# Patient Record
Sex: Female | Born: 1995 | Race: Black or African American | Hispanic: No | Marital: Single
Health system: Midwestern US, Community
[De-identification: ages and names within clinical notes are randomized; demographics above are authoritative.]

## PROBLEM LIST (undated history)

## (undated) ENCOUNTER — Inpatient Hospital Stay (HOSPITAL_COMMUNITY): Payer: Self-pay

## (undated) DIAGNOSIS — Z789 Other specified health status: Secondary | ICD-10-CM

## (undated) HISTORY — PX: NO PAST SURGERIES: SHX2092

---
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
## 2015-02-23 ENCOUNTER — Other Ambulatory Visit: Payer: Self-pay | Admitting: Family Medicine

## 2015-02-24 MED ORDER — NORETHIN ACE-ETH ESTRAD-FE 1-20 MG-MCG PO TABS
ORAL_TABLET | ORAL | Status: DC
Start: 2015-02-24 — End: 2015-06-13

## 2015-03-16 ENCOUNTER — Telehealth: Payer: Self-pay | Admitting: *Deleted

## 2015-03-16 NOTE — Telephone Encounter (Signed)
Unable to reach patient at time of Pre-Visit Call.  Left message for patient to return call when available.    

## 2015-03-17 ENCOUNTER — Encounter: Payer: Self-pay | Admitting: Medical

## 2015-03-17 ENCOUNTER — Ambulatory Visit (INDEPENDENT_AMBULATORY_CARE_PROVIDER_SITE_OTHER): Payer: 59 | Admitting: Medical

## 2015-03-17 VITALS — BP 109/73 | HR 78 | Temp 99.0°F | Wt 246.0 lb

## 2015-03-17 DIAGNOSIS — Z7689 Persons encountering health services in other specified circumstances: Secondary | ICD-10-CM | POA: Insufficient documentation

## 2015-03-17 DIAGNOSIS — Z0000 Encounter for general adult medical examination without abnormal findings: Secondary | ICD-10-CM | POA: Diagnosis not present

## 2015-03-17 DIAGNOSIS — Z0189 Encounter for other specified special examinations: Secondary | ICD-10-CM

## 2015-03-17 DIAGNOSIS — Z111 Encounter for screening for respiratory tuberculosis: Secondary | ICD-10-CM | POA: Diagnosis not present

## 2015-03-17 DIAGNOSIS — Z1329 Encounter for screening for other suspected endocrine disorder: Secondary | ICD-10-CM | POA: Insufficient documentation

## 2015-03-17 DIAGNOSIS — Z23 Encounter for immunization: Secondary | ICD-10-CM

## 2015-03-17 NOTE — Patient Instructions (Addendum)
Wellness examination Pt will get meningo boost, hpv vaccine second, and ppd today. Read ppd on Friday.  Will order cbc since on form and get ua.  Ptt given forms today. She will get provider at other clinic to read ppd since she won't be in area on Friday. Preventive Care for Adults A healthy lifestyle and preventive care can promote health and wellness. Preventive health guidelines for women include the following key practices.  A routine yearly physical is a good way to check with your health care provider about your health and preventive screening. It is a chance to share any concerns and updates on your health and to receive a thorough exam.  Visit your dentist for a routine exam and preventive care every 6 months. Brush your teeth twice a day and floss once a day. Good oral hygiene prevents tooth decay and gum disease.  The frequency of eye exams is based on your age, health, family medical history, use of contact lenses, and other factors. Follow your health care provider's recommendations for frequency of eye exams.  Eat a healthy diet. Foods like vegetables, fruits, whole grains, low-fat dairy products, and lean protein foods contain the nutrients you need without too many calories. Decrease your intake of foods high in solid fats, added sugars, and salt. Eat the right amount of calories for you.Get information about a proper diet from your health care provider, if necessary.  Regular physical exercise is one of the most important things you can do for your health. Most adults should get at least 150 minutes of moderate-intensity exercise (any activity that increases your heart rate and causes you to sweat) each week. In addition, most adults need muscle-strengthening exercises on 2 or more days a week.  Maintain a healthy weight. The body mass index (BMI) is a screening tool to identify possible weight problems. It provides an estimate of body fat based on height and weight. Your health  care provider can find your BMI and can help you achieve or maintain a healthy weight.For adults 20 years and older:  A BMI below 18.5 is considered underweight.  A BMI of 18.5 to 24.9 is normal.  A BMI of 25 to 29.9 is considered overweight.  A BMI of 30 and above is considered obese.  Maintain normal blood lipids and cholesterol levels by exercising and minimizing your intake of saturated fat. Eat a balanced diet with plenty of fruit and vegetables. Blood tests for lipids and cholesterol should begin at age 65 and be repeated every 5 years. If your lipid or cholesterol levels are high, you are over 50, or you are at high risk for heart disease, you may need your cholesterol levels checked more frequently.Ongoing high lipid and cholesterol levels should be treated with medicines if diet and exercise are not working.  If you smoke, find out from your health care provider how to quit. If you do not use tobacco, do not start.  Lung cancer screening is recommended for adults aged 34-80 years who are at high risk for developing lung cancer because of a history of smoking. A yearly low-dose CT scan of the lungs is recommended for people who have at least a 30-pack-year history of smoking and are a current smoker or have quit within the past 15 years. A pack year of smoking is smoking an average of 1 pack of cigarettes a day for 1 year (for example: 1 pack a day for 30 years or 2 packs a day for 15  years). Yearly screening should continue until the smoker has stopped smoking for at least 15 years. Yearly screening should be stopped for people who develop a health problem that would prevent them from having lung cancer treatment.  If you are pregnant, do not drink alcohol. If you are breastfeeding, be very cautious about drinking alcohol. If you are not pregnant and choose to drink alcohol, do not have more than 1 drink per day. One drink is considered to be 12 ounces (355 mL) of beer, 5 ounces (148 mL)  of wine, or 1.5 ounces (44 mL) of liquor.  Avoid use of street drugs. Do not share needles with anyone. Ask for help if you need support or instructions about stopping the use of drugs.  High blood pressure causes heart disease and increases the risk of stroke. Your blood pressure should be checked at least every 1 to 2 years. Ongoing high blood pressure should be treated with medicines if weight loss and exercise do not work.  If you are 3-33 years old, ask your health care provider if you should take aspirin to prevent strokes.  Diabetes screening involves taking a blood sample to check your fasting blood sugar level. This should be done once every 3 years, after age 87, if you are within normal weight and without risk factors for diabetes. Testing should be considered at a younger age or be carried out more frequently if you are overweight and have at least 1 risk factor for diabetes.  Breast cancer screening is essential preventive care for women. You should practice "breast self-awareness." This means understanding the normal appearance and feel of your breasts and may include breast self-examination. Any changes detected, no matter how small, should be reported to a health care provider. Women in their 63s and 30s should have a clinical breast exam (CBE) by a health care provider as part of a regular health exam every 1 to 3 years. After age 45, women should have a CBE every year. Starting at age 24, women should consider having a mammogram (breast X-ray test) every year. Women who have a family history of breast cancer should talk to their health care provider about genetic screening. Women at a high risk of breast cancer should talk to their health care providers about having an MRI and a mammogram every year.  Breast cancer gene (BRCA)-related cancer risk assessment is recommended for women who have family members with BRCA-related cancers. BRCA-related cancers include breast, ovarian, tubal,  and peritoneal cancers. Having family members with these cancers may be associated with an increased risk for harmful changes (mutations) in the breast cancer genes BRCA1 and BRCA2. Results of the assessment will determine the need for genetic counseling and BRCA1 and BRCA2 testing.  Routine pelvic exams to screen for cancer are no longer recommended for nonpregnant women who are considered low risk for cancer of the pelvic organs (ovaries, uterus, and vagina) and who do not have symptoms. Ask your health care provider if a screening pelvic exam is right for you.  If you have had past treatment for cervical cancer or a condition that could lead to cancer, you need Pap tests and screening for cancer for at least 20 years after your treatment. If Pap tests have been discontinued, your risk factors (such as having a new sexual partner) need to be reassessed to determine if screening should be resumed. Some women have medical problems that increase the chance of getting cervical cancer. In these cases, your health care  provider may recommend more frequent screening and Pap tests.  The HPV test is an additional test that may be used for cervical cancer screening. The HPV test looks for the virus that can cause the cell changes on the cervix. The cells collected during the Pap test can be tested for HPV. The HPV test could be used to screen women aged 47 years and older, and should be used in women of any age who have unclear Pap test results. After the age of 59, women should have HPV testing at the same frequency as a Pap test.  Colorectal cancer can be detected and often prevented. Most routine colorectal cancer screening begins at the age of 81 years and continues through age 63 years. However, your health care provider may recommend screening at an earlier age if you have risk factors for colon cancer. On a yearly basis, your health care provider may provide home test kits to check for hidden blood in the  stool. Use of a small camera at the end of a tube, to directly examine the colon (sigmoidoscopy or colonoscopy), can detect the earliest forms of colorectal cancer. Talk to your health care provider about this at age 12, when routine screening begins. Direct exam of the colon should be repeated every 5-10 years through age 54 years, unless early forms of pre-cancerous polyps or small growths are found.  People who are at an increased risk for hepatitis B should be screened for this virus. You are considered at high risk for hepatitis B if:  You were born in a country where hepatitis B occurs often. Talk with your health care provider about which countries are considered high risk.  Your parents were born in a high-risk country and you have not received a shot to protect against hepatitis B (hepatitis B vaccine).  You have HIV or AIDS.  You use needles to inject street drugs.  You live with, or have sex with, someone who has hepatitis B.  You get hemodialysis treatment.  You take certain medicines for conditions like cancer, organ transplantation, and autoimmune conditions.  Hepatitis C blood testing is recommended for all people born from [birth year] through 1965 and any individual with known risks for hepatitis C.  Practice safe sex. Use condoms and avoid high-risk sexual practices to reduce the spread of sexually transmitted infections (STIs). STIs include gonorrhea, chlamydia, syphilis, trichomonas, herpes, HPV, and human immunodeficiency virus (HIV). Herpes, HIV, and HPV are viral illnesses that have no cure. They can result in disability, cancer, and death.  You should be screened for sexually transmitted illnesses (STIs) including gonorrhea and chlamydia if:  You are sexually active and are younger than 24 years.  You are older than 24 years and your health care provider tells you that you are at risk for this type of infection.  Your sexual activity has changed since you were last  screened and you are at an increased risk for chlamydia or gonorrhea. Ask your health care provider if you are at risk.  If you are at risk of being infected with HIV, it is recommended that you take a prescription medicine daily to prevent HIV infection. This is called preexposure prophylaxis (PrEP). You are considered at risk if:  You are a heterosexual woman, are sexually active, and are at increased risk for HIV infection.  You take drugs by injection.  You are sexually active with a partner who has HIV.  Talk with your health care provider about whether you are  at high risk of being infected with HIV. If you choose to begin PrEP, you should first be tested for HIV. You should then be tested every 3 months for as long as you are taking PrEP.  Osteoporosis is a disease in which the bones lose minerals and strength with aging. This can result in serious bone fractures or breaks. The risk of osteoporosis can be identified using a bone density scan. Women ages 34 years and over and women at risk for fractures or osteoporosis should discuss screening with their health care providers. Ask your health care provider whether you should take a calcium supplement or vitamin D to reduce the rate of osteoporosis.  Menopause can be associated with physical symptoms and risks. Hormone replacement therapy is available to decrease symptoms and risks. You should talk to your health care provider about whether hormone replacement therapy is right for you.  Use sunscreen. Apply sunscreen liberally and repeatedly throughout the day. You should seek shade when your shadow is shorter than you. Protect yourself by wearing long sleeves, pants, a wide-brimmed hat, and sunglasses year round, whenever you are outdoors.  Once a month, do a whole body skin exam, using a mirror to look at the skin on your back. Tell your health care provider of new moles, moles that have irregular borders, moles that are larger than a pencil  eraser, or moles that have changed in shape or color.  Stay current with required vaccines (immunizations).  Influenza vaccine. All adults should be immunized every year.  Tetanus, diphtheria, and acellular pertussis (Td, Tdap) vaccine. Pregnant women should receive 1 dose of Tdap vaccine during each pregnancy. The dose should be obtained regardless of the length of time since the last dose. Immunization is preferred during the 27th-36th week of gestation. An adult who has not previously received Tdap or who does not know her vaccine status should receive 1 dose of Tdap. This initial dose should be followed by tetanus and diphtheria toxoids (Td) booster doses every 10 years. Adults with an unknown or incomplete history of completing a 3-dose immunization series with Td-containing vaccines should begin or complete a primary immunization series including a Tdap dose. Adults should receive a Td booster every 10 years.  Varicella vaccine. An adult without evidence of immunity to varicella should receive 2 doses or a second dose if she has previously received 1 dose. Pregnant females who do not have evidence of immunity should receive the first dose after pregnancy. This first dose should be obtained before leaving the health care facility. The second dose should be obtained 4-8 weeks after the first dose.  Human papillomavirus (HPV) vaccine. Females aged 13-26 years who have not received the vaccine previously should obtain the 3-dose series. The vaccine is not recommended for use in pregnant females. However, pregnancy testing is not needed before receiving a dose. If a female is found to be pregnant after receiving a dose, no treatment is needed. In that case, the remaining doses should be delayed until after the pregnancy. Immunization is recommended for any person with an immunocompromised condition through the age of 66 years if she did not get any or all doses earlier. During the 3-dose series, the  second dose should be obtained 4-8 weeks after the first dose. The third dose should be obtained 24 weeks after the first dose and 16 weeks after the second dose.  Zoster vaccine. One dose is recommended for adults aged 60 years or older unless certain conditions are present.  Measles, mumps, and rubella (MMR) vaccine. Adults born before 1957 generally are considered immune to measles and mumps. Adults born in 1957 or later should have 1 or more doses of MMR vaccine unless there is a contraindication to the vaccine or there is laboratory evidence of immunity to each of the three diseases. A routine second dose of MMR vaccine should be obtained at least 28 days after the first dose for students attending postsecondary schools, health care workers, or international travelers. People who received inactivated measles vaccine or an unknown type of measles vaccine during 1963-1967 should receive 2 doses of MMR vaccine. People who received inactivated mumps vaccine or an unknown type of mumps vaccine before 1979 and are at high risk for mumps infection should consider immunization with 2 doses of MMR vaccine. For females of childbearing age, rubella immunity should be determined. If there is no evidence of immunity, females who are not pregnant should be vaccinated. If there is no evidence of immunity, females who are pregnant should delay immunization until after pregnancy. Unvaccinated health care workers born before 1957 who lack laboratory evidence of measles, mumps, or rubella immunity or laboratory confirmation of disease should consider measles and mumps immunization with 2 doses of MMR vaccine or rubella immunization with 1 dose of MMR vaccine.  Pneumococcal 13-valent conjugate (PCV13) vaccine. When indicated, a person who is uncertain of her immunization history and has no record of immunization should receive the PCV13 vaccine. An adult aged 19 years or older who has certain medical conditions and has not  been previously immunized should receive 1 dose of PCV13 vaccine. This PCV13 should be followed with a dose of pneumococcal polysaccharide (PPSV23) vaccine. The PPSV23 vaccine dose should be obtained at least 8 weeks after the dose of PCV13 vaccine. An adult aged 19 years or older who has certain medical conditions and previously received 1 or more doses of PPSV23 vaccine should receive 1 dose of PCV13. The PCV13 vaccine dose should be obtained 1 or more years after the last PPSV23 vaccine dose.  Pneumococcal polysaccharide (PPSV23) vaccine. When PCV13 is also indicated, PCV13 should be obtained first. All adults aged 65 years and older should be immunized. An adult younger than age 65 years who has certain medical conditions should be immunized. Any person who resides in a nursing home or long-term care facility should be immunized. An adult smoker should be immunized. People with an immunocompromised condition and certain other conditions should receive both PCV13 and PPSV23 vaccines. People with human immunodeficiency virus (HIV) infection should be immunized as soon as possible after diagnosis. Immunization during chemotherapy or radiation therapy should be avoided. Routine use of PPSV23 vaccine is not recommended for American Indians, [place] Natives, or people younger than 65 years unless there are medical conditions that require PPSV23 vaccine. When indicated, people who have unknown immunization and have no record of immunization should receive PPSV23 vaccine. One-time revaccination 5 years after the first dose of PPSV23 is recommended for people aged 19-64 years who have chronic kidney failure, nephrotic syndrome, asplenia, or immunocompromised conditions. People who received 1-2 doses of PPSV23 before age 65 years should receive another dose of PPSV23 vaccine at age 65 years or later if at least 5 years have passed since the previous dose. Doses of PPSV23 are not needed for people immunized with PPSV23 at  or after age 65 years.  Meningococcal vaccine. Adults with asplenia or persistent complement component deficiencies should receive 2 doses of quadrivalent meningococcal conjugate (MenACWY-D) vaccine.   The doses should be obtained at least 2 months apart. Microbiologists working with certain meningococcal bacteria, [place] recruits, people at risk during an outbreak, and people who travel to or live in countries with a high rate of meningitis should be immunized. A first-year college student up through age 29 years who is living in a residence hall should receive a dose if she did not receive a dose on or after her 16th birthday. Adults who have certain high-risk conditions should receive one or more doses of vaccine.  Hepatitis A vaccine. Adults who wish to be protected from this disease, have certain high-risk conditions, work with hepatitis A-infected animals, work in hepatitis A research labs, or travel to or work in countries with a high rate of hepatitis A should be immunized. Adults who were previously unvaccinated and who anticipate close contact with an international adoptee during the first 60 days after arrival in the [place] from a country with a high rate of hepatitis A should be immunized.  Hepatitis B vaccine. Adults who wish to be protected from this disease, have certain high-risk conditions, may be exposed to blood or other infectious body fluids, are household contacts or sex partners of hepatitis B positive people, are clients or workers in certain care facilities, or travel to or work in countries with a high rate of hepatitis B should be immunized.  Haemophilus influenzae type b (Hib) vaccine. A previously unvaccinated person with asplenia or sickle cell disease or having a scheduled splenectomy should receive 1 dose of Hib vaccine. Regardless of previous immunization, a recipient of a hematopoietic stem cell transplant should receive a 3-dose series 6-12 months after her  successful transplant. Hib vaccine is not recommended for adults with HIV infection. Preventive Services / Frequency Ages 52 to 38 years  Blood pressure check.** / Every 1 to 2 years.  Lipid and cholesterol check.** / Every 5 years beginning at age 16.  Clinical breast exam.** / Every 3 years for women in their 41s and 77s.  BRCA-related cancer risk assessment.** / For women who have family members with a BRCA-related cancer (breast, ovarian, tubal, or peritoneal cancers).  Pap test.** / Every 2 years from ages 17 through 74. Every 3 years starting at age 11 through age 69 or 61 with a history of 3 consecutive normal Pap tests.  HPV screening.** / Every 3 years from ages 12 through ages 3 to 1 with a history of 3 consecutive normal Pap tests.  Hepatitis C blood test.** / For any individual with known risks for hepatitis C.  Skin self-exam. / Monthly.  Influenza vaccine. / Every year.  Tetanus, diphtheria, and acellular pertussis (Tdap, Td) vaccine.** / Consult your health care provider. Pregnant women should receive 1 dose of Tdap vaccine during each pregnancy. 1 dose of Td every 10 years.  Varicella vaccine.** / Consult your health care provider. Pregnant females who do not have evidence of immunity should receive the first dose after pregnancy.  HPV vaccine. / 3 doses over 6 months, if 30 and younger. The vaccine is not recommended for use in pregnant females. However, pregnancy testing is not needed before receiving a dose.  Measles, mumps, rubella (MMR) vaccine.** / You need at least 1 dose of MMR if you were born in 1957 or later. You may also need a 2nd dose. For females of childbearing age, rubella immunity should be determined. If there is no evidence of immunity, females who are not pregnant should be vaccinated. If there is  no evidence of immunity, females who are pregnant should delay immunization until after pregnancy.  Pneumococcal 13-valent conjugate (PCV13) vaccine.** /  Consult your health care provider.  Pneumococcal polysaccharide (PPSV23) vaccine.** / 1 to 2 doses if you smoke cigarettes or if you have certain conditions.  Meningococcal vaccine.** / 1 dose if you are age 19 to 21 years and a first-year college student living in a residence hall, or have one of several medical conditions, you need to get vaccinated against meningococcal disease. You may also need additional booster doses.  Hepatitis A vaccine.** / Consult your health care provider.  Hepatitis B vaccine.** / Consult your health care provider.  Haemophilus influenzae type b (Hib) vaccine.** / Consult your health care provider. Ages 40 to 64 years  Blood pressure check.** / Every 1 to 2 years.  Lipid and cholesterol check.** / Every 5 years beginning at age 20 years.  Lung cancer screening. / Every year if you are aged 55-80 years and have a 30-pack-year history of smoking and currently smoke or have quit within the past 15 years. Yearly screening is stopped once you have quit smoking for at least 15 years or develop a health problem that would prevent you from having lung cancer treatment.  Clinical breast exam.** / Every year after age 40 years.  BRCA-related cancer risk assessment.** / For women who have family members with a BRCA-related cancer (breast, ovarian, tubal, or peritoneal cancers).  Mammogram.** / Every year beginning at age 40 years and continuing for as long as you are in good health. Consult with your health care provider.  Pap test.** / Every 3 years starting at age 30 years through age 65 or 70 years with a history of 3 consecutive normal Pap tests.  HPV screening.** / Every 3 years from ages 30 years through ages 65 to 70 years with a history of 3 consecutive normal Pap tests.  Fecal occult blood test (FOBT) of stool. / Every year beginning at age 50 years and continuing until age 75 years. You may not need to do this test if you get a colonoscopy every 10  years.  Flexible sigmoidoscopy or colonoscopy.** / Every 5 years for a flexible sigmoidoscopy or every 10 years for a colonoscopy beginning at age 50 years and continuing until age 75 years.  Hepatitis C blood test.** / For all people born from 1945 through 1965 and any individual with known risks for hepatitis C.  Skin self-exam. / Monthly.  Influenza vaccine. / Every year.  Tetanus, diphtheria, and acellular pertussis (Tdap/Td) vaccine.** / Consult your health care provider. Pregnant women should receive 1 dose of Tdap vaccine during each pregnancy. 1 dose of Td every 10 years.  Varicella vaccine.** / Consult your health care provider. Pregnant females who do not have evidence of immunity should receive the first dose after pregnancy.  Zoster vaccine.** / 1 dose for adults aged 60 years or older.  Measles, mumps, rubella (MMR) vaccine.** / You need at least 1 dose of MMR if you were born in 1957 or later. You may also need a 2nd dose. For females of childbearing age, rubella immunity should be determined. If there is no evidence of immunity, females who are not pregnant should be vaccinated. If there is no evidence of immunity, females who are pregnant should delay immunization until after pregnancy.  Pneumococcal 13-valent conjugate (PCV13) vaccine.** / Consult your health care provider.  Pneumococcal polysaccharide (PPSV23) vaccine.** / 1 to 2 doses if you smoke cigarettes   or if you have certain conditions.  Meningococcal vaccine.** / Consult your health care provider.  Hepatitis A vaccine.** / Consult your health care provider.  Hepatitis B vaccine.** / Consult your health care provider.  Haemophilus influenzae type b (Hib) vaccine.** / Consult your health care provider. Ages 47 years and over  Blood pressure check.** / Every 1 to 2 years.  Lipid and cholesterol check.** / Every 5 years beginning at age 48 years.  Lung cancer screening. / Every year if you are aged 68-80 years  and have a 30-pack-year history of smoking and currently smoke or have quit within the past 15 years. Yearly screening is stopped once you have quit smoking for at least 15 years or develop a health problem that would prevent you from having lung cancer treatment.  Clinical breast exam.** / Every year after age 60 years.  BRCA-related cancer risk assessment.** / For women who have family members with a BRCA-related cancer (breast, ovarian, tubal, or peritoneal cancers).  Mammogram.** / Every year beginning at age 64 years and continuing for as long as you are in good health. Consult with your health care provider.  Pap test.** / Every 3 years starting at age 15 years through age 14 or 23 years with 3 consecutive normal Pap tests. Testing can be stopped between 65 and 70 years with 3 consecutive normal Pap tests and no abnormal Pap or HPV tests in the past 10 years.  HPV screening.** / Every 3 years from ages 35 years through ages 29 or 55 years with a history of 3 consecutive normal Pap tests. Testing can be stopped between 65 and 70 years with 3 consecutive normal Pap tests and no abnormal Pap or HPV tests in the past 10 years.  Fecal occult blood test (FOBT) of stool. / Every year beginning at age 72 years and continuing until age 46 years. You may not need to do this test if you get a colonoscopy every 10 years.  Flexible sigmoidoscopy or colonoscopy.** / Every 5 years for a flexible sigmoidoscopy or every 10 years for a colonoscopy beginning at age 48 years and continuing until age 50 years.  Hepatitis C blood test.** / For all people born from [birth year] through 1965 and any individual with known risks for hepatitis C.  Osteoporosis screening.** / A one-time screening for women ages 25 years and over and women at risk for fractures or osteoporosis.  Skin self-exam. / Monthly.  Influenza vaccine. / Every year.  Tetanus, diphtheria, and acellular pertussis (Tdap/Td) vaccine.** / 1 dose of Td  every 10 years.  Varicella vaccine.** / Consult your health care provider.  Zoster vaccine.** / 1 dose for adults aged 76 years or older.  Pneumococcal 13-valent conjugate (PCV13) vaccine.** / Consult your health care provider.  Pneumococcal polysaccharide (PPSV23) vaccine.** / 1 dose for all adults aged 3 years and older.  Meningococcal vaccine.** / Consult your health care provider.  Hepatitis A vaccine.** / Consult your health care provider.  Hepatitis B vaccine.** / Consult your health care provider.  Haemophilus influenzae type b (Hib) vaccine.** / Consult your health care provider. ** Family history and personal history of risk and conditions may change your health care provider's recommendations. Document Released: 11/14/2001 Document Revised: 02/02/2014 Document Reviewed: 02/13/2011 [facility] Patient Information 2015 [place], [place]. This information is not intended to replace advice given to you by your health care provider. Make sure you discuss any questions you have with your health care provider.
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
## 2015-03-17 NOTE — Assessment & Plan Note (Signed)
Pt will get meningo boost, hpv vaccine second, and ppd today. Read ppd on Friday.  Will order cbc since on form and get ua.

## 2015-03-18 LAB — CBC WITH DIFFERENTIAL/PLATELET
Basophils Absolute: 0.1 10*3/uL (ref 0.0–0.1)
Basophils Relative: 0.6 % (ref 0.0–3.0)
EOS ABS: 0.1 10*3/uL (ref 0.0–0.7)
Eosinophils Relative: 1 % (ref 0.0–5.0)
HCT: 35.3 % — ABNORMAL LOW (ref 36.0–49.0)
Hemoglobin: 11.3 g/dL — ABNORMAL LOW (ref 12.0–16.0)
LYMPHS PCT: 33.7 % (ref 24.0–48.0)
Lymphs Abs: 3.1 10*3/uL (ref 0.7–4.0)
MCHC: 32.1 g/dL (ref 31.0–37.0)
MCV: 79.6 fl (ref 78.0–98.0)
Monocytes Absolute: 0.2 10*3/uL (ref 0.1–1.0)
Monocytes Relative: 1.8 % — ABNORMAL LOW (ref 3.0–12.0)
NEUTROS PCT: 62.9 % (ref 43.0–71.0)
Neutro Abs: 5.7 10*3/uL (ref 1.4–7.7)
Platelets: 471 10*3/uL (ref 150.0–575.0)
RBC: 4.43 Mil/uL (ref 3.80–5.70)
RDW: 15.8 % — ABNORMAL HIGH (ref 11.4–15.5)
WBC: 9.1 10*3/uL (ref 4.5–13.5)

## 2015-03-18 NOTE — Addendum Note (Signed)
Addended by: Lurline Hare on: 03/18/2015 08:37 AM   Modules accepted: Orders

## 2015-03-19 LAB — TB SKIN TEST
Induration: 0 mm
TB Skin Test: NEGATIVE

## 2015-03-19 NOTE — Addendum Note (Signed)
Addended by: Noreene Larsson A on: 03/19/2015 02:09 PM   Modules accepted: Orders

## 2015-04-28 ENCOUNTER — Encounter: Payer: Self-pay | Admitting: Physician Assistant

## 2015-04-28 ENCOUNTER — Other Ambulatory Visit: Payer: Self-pay | Admitting: Physician Assistant

## 2015-04-28 ENCOUNTER — Ambulatory Visit (INDEPENDENT_AMBULATORY_CARE_PROVIDER_SITE_OTHER): Payer: 59 | Admitting: Physician Assistant

## 2015-04-28 VITALS — BP 120/62 | HR 91 | Temp 97.9°F | Ht 66.5 in | Wt 244.0 lb

## 2015-04-28 DIAGNOSIS — Z7251 High risk heterosexual behavior: Secondary | ICD-10-CM

## 2015-04-28 NOTE — Assessment & Plan Note (Signed)
Urine pregnancy negative. Will obtain serum pregnancy due to her anxiety/concerns. Discussed restarting OCPs to prevent this scare again if serum pregnancy is negative.  Safe sex practices reviewed. Will follow-up based on lab results.

## 2015-04-28 NOTE — Progress Notes (Signed)
Patient presents to clinic today c/o skipped period. Endorses LMP at beginning of June. Is currently sexually active without consistent use of protection. Is prescribed OCPs but has not taken in over 2 months per patient. Patient feels she may be pregnant. Has taken a home pregnancy test that was negative. Was told by manufacturer if she had concerns, she should see healthcare provider.  Past Medical History  Diagnosis Date  . Urinary incontinence   . Menstrual cramps     Current Outpatient Prescriptions on File Prior to Visit  Medication Sig Dispense Refill  . naproxen sodium (ANAPROX) 220 MG tablet Take 220 mg by mouth as needed.    . norethindrone-ethinyl estradiol (GILDESS FE 1/20) 1-20 MG-MCG tablet Take 1 tablet by mouth  daily (Patient not taking: Reported on 04/28/2015) 28 tablet 6   No current facility-administered medications on file prior to visit.    No Known Allergies  Family History  Problem Relation Age of Onset  . Prostate cancer    . Diabetes Maternal Grandmother   . Diabetes Maternal Grandfather   . Heart disease Maternal Grandfather   . Hypertension Maternal Grandfather     History   Social History  . Marital Status: Single    Spouse Name: N/A  . Number of Children: N/A  . Years of Education: N/A   Social History Main Topics  . Smoking status: Never Smoker   . Smokeless tobacco: Never Used  . Alcohol Use: No  . Drug Use: No  . Sexual Activity: No   Other Topics Concern  . None   Social History Narrative   Exercise--- softball    Review of Systems - See HPI.  All other ROS are negative.  BP 120/62 mmHg  Pulse 91  Temp(Src) 97.9 F (36.6 C) (Oral)  Ht 5' 6.5" (1.689 m)  Wt 244 lb (110.678 kg)  BMI 38.80 kg/m2  SpO2 98%  LMP 03/17/2015  Physical Exam  Constitutional: She is oriented to person, place, and time and well-developed, well-nourished, and in no distress.  HENT:  Head: Normocephalic and atraumatic.  Cardiovascular: Normal  rate, regular rhythm, normal heart sounds and intact distal pulses.   Pulmonary/Chest: Effort normal and breath sounds normal. No respiratory distress. She has no wheezes. She has no rales. She exhibits no tenderness.  Neurological: She is alert and oriented to person, place, and time.  Vitals reviewed.   Recent Results (from the past 2160 hour(s))  CBC w/Diff     Status: Abnormal   Collection Time: 03/17/15  2:51 PM  Result Value Ref Range   WBC 9.1 4.5 - 13.5 K/uL   RBC 4.43 3.80 - 5.70 Mil/uL   Hemoglobin 11.3 (L) 12.0 - 16.0 g/dL   HCT 60.4 (L) 54.0 - 98.1 %   MCV 79.6 78.0 - 98.0 fl   MCHC 32.1 31.0 - 37.0 g/dL   RDW 19.1 (H) 47.8 - 29.5 %   Platelets 471.0 150.0 - 575.0 K/uL   Neutrophils Relative % 62.9 43.0 - 71.0 %   Lymphocytes Relative 33.7 24.0 - 48.0 %   Monocytes Relative 1.8 (L) 3.0 - 12.0 %   Eosinophils Relative 1.0 0.0 - 5.0 %   Basophils Relative 0.6 0.0 - 3.0 %   Neutro Abs 5.7 1.4 - 7.7 K/uL   Lymphs Abs 3.1 0.7 - 4.0 K/uL   Monocytes Absolute 0.2 0.1 - 1.0 K/uL   Eosinophils Absolute 0.1 0.0 - 0.7 K/uL   Basophils Absolute 0.1 0.0 -  0.1 K/uL  PPD     Status: Normal   Collection Time: 03/19/15  2:00 PM  Result Value Ref Range   TB Skin Test Negative    Induration 0 mm    Assessment/Plan: Unprotected sex Urine pregnancy negative. Will obtain serum pregnancy due to her anxiety/concerns. Discussed restarting OCPs to prevent this scare again if serum pregnancy is negative.  Safe sex practices reviewed. Will follow-up based on lab results.

## 2015-04-28 NOTE — Progress Notes (Signed)
Pre visit review using our clinic review tool, if applicable. No additional management support is needed unless otherwise documented below in the visit note. 

## 2015-04-28 NOTE — Patient Instructions (Signed)
Your urine pregnancy test is negative. Since you are worried, please stop by front desk to schedule a lab appointment so we can do a blood pregnancy test.  I will call you with your results.  If all is negative I recommend you restart your birth control medication to help reduce scares in the future. Always remember to practice safe sex!

## 2015-04-29 ENCOUNTER — Other Ambulatory Visit: Payer: 59

## 2015-05-20 ENCOUNTER — Ambulatory Visit: Payer: 59 | Admitting: Family Medicine

## 2015-06-13 ENCOUNTER — Inpatient Hospital Stay (HOSPITAL_COMMUNITY): Payer: 59

## 2015-06-13 ENCOUNTER — Encounter (HOSPITAL_COMMUNITY): Payer: Self-pay | Admitting: *Deleted

## 2015-06-13 ENCOUNTER — Inpatient Hospital Stay (HOSPITAL_COMMUNITY)
Admission: AD | Admit: 2015-06-13 | Discharge: 2015-06-13 | Disposition: A | Discharge status: Home / Self Care | Payer: 59 | Source: Ambulatory Visit | Attending: Obstetrics & Gynecology | Admitting: Obstetrics & Gynecology

## 2015-06-13 DIAGNOSIS — O2341 Unspecified infection of urinary tract in pregnancy, first trimester: Secondary | ICD-10-CM

## 2015-06-13 DIAGNOSIS — O26899 Other specified pregnancy related conditions, unspecified trimester: Secondary | ICD-10-CM

## 2015-06-13 DIAGNOSIS — O9989 Other specified diseases and conditions complicating pregnancy, childbirth and the puerperium: Secondary | ICD-10-CM

## 2015-06-13 DIAGNOSIS — R109 Unspecified abdominal pain: Secondary | ICD-10-CM | POA: Diagnosis not present

## 2015-06-13 DIAGNOSIS — Z3A08 8 weeks gestation of pregnancy: Secondary | ICD-10-CM | POA: Diagnosis not present

## 2015-06-13 DIAGNOSIS — Z3491 Encounter for supervision of normal pregnancy, unspecified, first trimester: Secondary | ICD-10-CM

## 2015-06-13 HISTORY — DX: Other specified health status: Z78.9

## 2015-06-13 LAB — URINALYSIS, ROUTINE W REFLEX MICROSCOPIC
BILIRUBIN URINE: NEGATIVE
Glucose, UA: NEGATIVE mg/dL
Ketones, ur: NEGATIVE mg/dL
Nitrite: NEGATIVE
Protein, ur: NEGATIVE mg/dL
Specific Gravity, Urine: >1.03 — ABNORMAL HIGH (ref 1.005–1.030)
UROBILINOGEN UA: 0.2 mg/dL (ref 0.0–1.0)
pH: 5.5 (ref 5.0–8.0)

## 2015-06-13 LAB — CBC
HEMATOCRIT: 36 % (ref 36.0–46.0)
HEMOGLOBIN: 11.9 g/dL — AB (ref 12.0–15.0)
MCH: 26.2 pg (ref 26.0–34.0)
MCHC: 33.1 g/dL (ref 30.0–36.0)
MCV: 79.1 fL (ref 78.0–100.0)
Platelets: 415 10*3/uL — ABNORMAL HIGH (ref 150–400)
RBC: 4.55 MIL/uL (ref 3.87–5.11)
RDW: 15.8 % — ABNORMAL HIGH (ref 11.5–15.5)
WBC: 9.7 10*3/uL (ref 4.0–10.5)

## 2015-06-13 LAB — URINE MICROSCOPIC-ADD ON: RBC / HPF: NONE SEEN RBC/hpf (ref <3)

## 2015-06-13 LAB — WET PREP, GENITAL
TRICH WET PREP: NONE SEEN
YEAST WET PREP: NONE SEEN

## 2015-06-13 LAB — ABO/RH: ABO/RH(D): O POS

## 2015-06-13 LAB — HCG, QUANTITATIVE, PREGNANCY: HCG, BETA CHAIN, QUANT, S: 123257 m[IU]/mL — AB (ref <5)

## 2015-06-13 MED ORDER — CEPHALEXIN 500 MG PO CAPS: 500.0000 mg | ORAL_CAPSULE | Freq: Four times a day (QID) | ORAL | Status: DC

## 2015-06-13 NOTE — MAU Note (Signed)
Having abd cramping- mid abd. Started yesterday, was really bad this morning. Has been vomiting, denies diarrhea or constipation. Denies dysuria

## 2015-06-13 NOTE — MAU Note (Signed)
preg confirmed at [facility] '

## 2015-06-13 NOTE — MAU Provider Note (Signed)
Chief Complaint: Abdominal Cramping   First Provider Initiated Contact with Patient 06/13/15 1739      SUBJECTIVE HPI: Maria Simpson is a 19 y.o. G1P0 at [redacted]w[redacted]d by LMP who presents to maternity admissions reporting abdominal pain described as cramping in her lower abdomen x 3-4 days, worsening today.  She has not yet started prenatal care in this pregnancy.   She denies vaginal bleeding, vaginal itching/burning, urinary symptoms, h/a, dizziness, n/v, or fever/chills.     Abdominal Cramping This is a new problem. The current episode started in the past 7 days. The onset quality is gradual. The problem occurs intermittently. The problem has been waxing and waning. The pain is located in the RLQ, LLQ and suprapubic region. The pain is moderate. The quality of the pain is cramping and sharp. The abdominal pain does not radiate. Associated symptoms include nausea and vomiting. Pertinent negatives include no constipation, diarrhea, dysuria, fever, frequency or headaches. She has tried nothing for the symptoms.    Past Medical History  Diagnosis Date  . Urinary incontinence   . Menstrual cramps   . Medical history non-contributory    Past Surgical History  Procedure Laterality Date  . No past surgeries     Social History   Social History  . Marital Status: Single    Spouse Name: N/A  . Number of Children: N/A  . Years of Education: N/A   Occupational History  . Not on file.   Social History Main Topics  . Smoking status: Never Smoker   . Smokeless tobacco: Never Used  . Alcohol Use: No  . Drug Use: No  . Sexual Activity: Yes    Birth Control/ Protection: None   Other Topics Concern  . Not on file   Social History Narrative   Exercise--- softball   No current facility-administered medications on file prior to encounter.   Current Outpatient Prescriptions on File Prior to Encounter  Medication Sig Dispense Refill  . norethindrone-ethinyl estradiol (GILDESS FE 1/20) 1-20  MG-MCG tablet Take 1 tablet by mouth  daily (Patient not taking: Reported on 04/28/2015) 28 tablet 6   No Known Allergies  ROS:  Review of Systems  Constitutional: Negative for fever, chills and fatigue.  HENT: Negative for sinus pressure.   Eyes: Negative for photophobia.  Respiratory: Negative for shortness of breath.   Cardiovascular: Negative for chest pain.  Gastrointestinal: Positive for nausea and vomiting. Negative for diarrhea and constipation.  Genitourinary: Negative for dysuria, frequency, flank pain, vaginal bleeding, vaginal discharge, difficulty urinating, vaginal pain and pelvic pain.  Musculoskeletal: Negative for neck pain.  Neurological: Negative for dizziness, weakness and headaches.  Psychiatric/Behavioral: Negative.      I have reviewed patient's Past Medical Hx, Surgical Hx, Family Hx, Social Hx, medications and allergies.   Physical Exam   Patient Vitals for the past 24 hrs:  BP Temp Temp src Pulse Resp Height Weight  06/13/15 1637 109/76 mmHg 98.8 F (37.1 C) Oral 94 18  (1.702 m) 107.593 kg (237 lb 3.2 oz)   Constitutional: Well-developed, well-nourished female in no acute distress.  Cardiovascular: normal rate Respiratory: normal effort GI: Abd soft, non-tender. Pos BS x 4 MS: Extremities nontender, no edema, normal ROM Neurologic: Alert and oriented x 4.  GU: Neg CVAT.  PELVIC EXAM: Cervix pink, visually closed, without lesion, scant white creamy discharge, vaginal walls and external genitalia normal Bimanual exam: Cervix 0/long/high, firm, anterior, neg CMT, uterus nontender, nonenlarged, adnexa without tenderness, enlargement, or mass  LAB RESULTS Results for orders placed or performed during the hospital encounter of 06/13/15 (from the past 24 hour(s))  Urinalysis, Routine w reflex microscopic (not at [facility])     Status: Abnormal   Collection Time: 06/13/15  4:40 PM  Result Value Ref Range   Color, Urine YELLOW YELLOW   APPearance HAZY  (A) CLEAR   Specific Gravity, Urine >1.030 (H) 1.005 - 1.030   pH 5.5 5.0 - 8.0   Glucose, UA NEGATIVE NEGATIVE mg/dL   Hgb urine dipstick TRACE (A) NEGATIVE   Bilirubin Urine NEGATIVE NEGATIVE   Ketones, ur NEGATIVE NEGATIVE mg/dL   Protein, ur NEGATIVE NEGATIVE mg/dL   Urobilinogen, UA 0.2 0.0 - 1.0 mg/dL   Nitrite NEGATIVE NEGATIVE   Leukocytes, UA MODERATE (A) NEGATIVE  Urine microscopic-add on     Status: None   Collection Time: 06/13/15  4:40 PM  Result Value Ref Range   Squamous Epithelial / LPF RARE RARE   WBC, UA TOO NUMEROUS TO COUNT <3 WBC/hpf   RBC / HPF  <3 RBC/hpf    NO FORMED ELEMENTS SEEN ON URINE MICROSCOPIC EXAMINATION   Bacteria, UA RARE RARE   Urine-Other MUCOUS PRESENT   CBC     Status: Abnormal   Collection Time: 06/13/15  4:45 PM  Result Value Ref Range   WBC 9.7 4.0 - 10.5 K/uL   RBC 4.55 3.87 - 5.11 MIL/uL   Hemoglobin 11.9 (L) 12.0 - 15.0 g/dL   HCT 16.1 09.6 - 04.5 %   MCV 79.1 78.0 - 100.0 fL   MCH 26.2 26.0 - 34.0 pg   MCHC 33.1 30.0 - 36.0 g/dL   RDW 40.9 (H) 81.1 - 91.4 %   Platelets 415 (H) 150 - 400 K/uL  hCG, quantitative, pregnancy     Status: Abnormal   Collection Time: 06/13/15  5:15 PM  Result Value Ref Range   hCG, Beta Chain, Quant, S 123257 (H) <5 mIU/mL  ABO/Rh     Status: None (Preliminary result)   Collection Time: 06/13/15  5:15 PM  Result Value Ref Range   ABO/RH(D) O POS   Wet prep, genital     Status: Abnormal   Collection Time: 06/13/15  5:28 PM  Result Value Ref Range   Yeast Wet Prep HPF POC NONE SEEN NONE SEEN   Trich, Wet Prep NONE SEEN NONE SEEN   Clue Cells Wet Prep HPF POC RARE (A) NONE SEEN   WBC, Wet Prep HPF POC FEW (A) NONE SEEN    --/--/O POS (09/11 1715)  IMAGING US Ob Comp Less 14 Wks  06/13/2015   CLINICAL DATA:  First trimester pregnancy, abdominal pain.  EXAM: OBSTETRIC <14 WK [place] AND TRANSVAGINAL OB US  TECHNIQUE: Both transabdominal and transvaginal ultrasound examinations were performed for  complete evaluation of the gestation as well as the maternal uterus, adnexal regions, and pelvic cul-de-sac. Transvaginal technique was performed to assess early pregnancy.  COMPARISON:  None.  FINDINGS: Intrauterine gestational sac: Visualized/normal in shape.  Yolk sac:  Visualized.  Embryo:  Visualized.  Cardiac Activity: Visualized.  Heart Rate: 157  bpm  CRL:  19.2  mm   8 w   3 d                  [place] EDC: January 20, 2016.  Maternal uterus/adnexae: No free fluid is noted. Ovaries appear normal.  IMPRESSION: Single live intrauterine gestation of 8 weeks 3 days.   Electronically Signed   By: Fayrene Fearing  Christen Butter, M.D.   On: 06/13/2015 19:50   US Ob Transvaginal  06/13/2015   CLINICAL DATA:  First trimester pregnancy, abdominal pain.  EXAM: OBSTETRIC <14 WK [place] AND TRANSVAGINAL OB US  TECHNIQUE: Both transabdominal and transvaginal ultrasound examinations were performed for complete evaluation of the gestation as well as the maternal uterus, adnexal regions, and pelvic cul-de-sac. Transvaginal technique was performed to assess early pregnancy.  COMPARISON:  None.  FINDINGS: Intrauterine gestational sac: Visualized/normal in shape.  Yolk sac:  Visualized.  Embryo:  Visualized.  Cardiac Activity: Visualized.  Heart Rate: 157  bpm  CRL:  19.2  mm   8 w   3 d                  [place] EDC: January 20, 2016.  Maternal uterus/adnexae: No free fluid is noted. Ovaries appear normal.  IMPRESSION: Single live intrauterine gestation of 8 weeks 3 days.   Electronically Signed   By: Lupita Raider, M.D.   On: 06/13/2015 19:50    MAU Management/MDM: Ordered labs and [place] and reviewed results. UA with large leukocytes and suprapubic pain.  Treat for UTI, urine sent for culture.  Pt stable at time of discharge.  ASSESSMENT 1. Normal IUP (intrauterine pregnancy) on prenatal ultrasound, first trimester   2. Abdominal pain in pregnancy   3. UTI in pregnancy, antepartum, first trimester     PLAN Discharge home with first trimester  pregnancy precautions Urine sent for culture Keflex 500 mg QID x 7 days Discussed safe OTC medications for nausea F/U with early prenatal care Return to MAU as needed for emergencies    Medication List    STOP taking these medications        norethindrone-ethinyl estradiol 1-20 MG-MCG tablet  Commonly known as:  GILDESS FE 1/20      TAKE these medications        cephALEXin 500 MG capsule  Commonly known as:  KEFLEX  Take 1 capsule (500 mg total) by mouth 4 (four) times daily.     prenatal multivitamin Tabs tablet  Take 1 tablet by mouth daily.       Follow-up Information    Please follow up.   Why:  Start prenatal care as soon as possible      Follow up with [facility].   Why:  As needed for emergencies   Contact information:   [address] [id number] mc [place] [address] [phone]      Sharen Counter Certified Nurse-Midwife 06/13/2015  8:00 PM

## 2015-06-13 NOTE — Discharge Instructions (Signed)

## 2015-06-14 LAB — HIV ANTIBODY (ROUTINE TESTING W REFLEX): HIV SCREEN 4TH GENERATION: NONREACTIVE

## 2015-06-14 LAB — GC/CHLAMYDIA PROBE AMP (~~LOC~~) NOT AT ARMC
Chlamydia: NEGATIVE
Neisseria Gonorrhea: NEGATIVE

## 2015-06-16 LAB — CULTURE, OB URINE: Culture: 80000

## 2015-06-17 ENCOUNTER — Telehealth (HOSPITAL_COMMUNITY): Payer: Self-pay | Admitting: Obstetrics and Gynecology

## 2015-06-17 MED ORDER — SULFAMETHOXAZOLE-TRIMETHOPRIM 800-160 MG PO TABS: 1.0000 | ORAL_TABLET | Freq: Two times a day (BID) | ORAL | Status: AC

## 2015-06-17 NOTE — Telephone Encounter (Signed)
+   urine culture- antibiotics changed due to sensitivity. Discussed with Dr. Shawnie Pons. Bractrim sent to Pharmacy.

## 2015-07-27 LAB — OB RESULTS CONSOLE RPR: RPR: NONREACTIVE

## 2015-07-27 LAB — OB RESULTS CONSOLE ANTIBODY SCREEN: ANTIBODY SCREEN: NEGATIVE

## 2015-07-27 LAB — OB RESULTS CONSOLE ABO/RH: RH TYPE: POSITIVE

## 2015-07-27 LAB — OB RESULTS CONSOLE HEPATITIS B SURFACE ANTIGEN: HEP B S AG: NEGATIVE

## 2015-07-27 LAB — OB RESULTS CONSOLE HIV ANTIBODY (ROUTINE TESTING): HIV: NONREACTIVE

## 2015-07-27 LAB — OB RESULTS CONSOLE RUBELLA ANTIBODY, IGM: RUBELLA: IMMUNE

## 2015-10-03 NOTE — L&D Delivery Note (Signed)
Delivery Note Pt pushed great and at 9:01 AM a viable female was delivered via Vaginal, Spontaneous Delivery (Presentation: ; Occiput Anterior).  APGAR: 9, 9; weight pending .   Placenta status: Intact, Spontaneous.  Cord: 3 vessels with the following complications: None. Meconium stained fluid bulb suctioned with vigorous cry. Anesthesia: Epidural  Episiotomy: None Lacerations: 1st degree;Labial Suture Repair: 3.0 vicryl rapide for hemostasis Est. Blood Loss (mL): 225ml  Mom to postpartum.  Baby to Couplet care / Skin to Skin. D/w pt circurmcision and she will likely proceed in the office.  Oliver PilaRICHARDSON,Maria Simpson, 9:26 AM

## 2015-12-23 LAB — OB RESULTS CONSOLE GBS: GBS: NEGATIVE

## 2015-12-23 LAB — OB RESULTS CONSOLE GC/CHLAMYDIA
Chlamydia: NEGATIVE
GC PROBE AMP, GENITAL: NEGATIVE

## 2016-01-14 ENCOUNTER — Inpatient Hospital Stay (HOSPITAL_COMMUNITY)
Admission: AD | Admit: 2016-01-14 | Discharge: 2016-01-17 | DRG: 775 | Disposition: A | Discharge status: Home / Self Care | Payer: 59 | Source: Ambulatory Visit | Attending: Obstetrics and Gynecology | Admitting: Obstetrics and Gynecology

## 2016-01-14 DIAGNOSIS — Z8249 Family history of ischemic heart disease and other diseases of the circulatory system: Secondary | ICD-10-CM

## 2016-01-14 DIAGNOSIS — Z833 Family history of diabetes mellitus: Secondary | ICD-10-CM

## 2016-01-14 DIAGNOSIS — Z3A39 39 weeks gestation of pregnancy: Secondary | ICD-10-CM

## 2016-01-15 ENCOUNTER — Inpatient Hospital Stay (HOSPITAL_COMMUNITY): Payer: 59 | Admitting: Anesthesiology

## 2016-01-15 ENCOUNTER — Encounter (HOSPITAL_COMMUNITY): Payer: Self-pay

## 2016-01-15 DIAGNOSIS — Z3A39 39 weeks gestation of pregnancy: Secondary | ICD-10-CM

## 2016-01-15 DIAGNOSIS — Z8249 Family history of ischemic heart disease and other diseases of the circulatory system: Secondary | ICD-10-CM | POA: Diagnosis not present

## 2016-01-15 DIAGNOSIS — Z833 Family history of diabetes mellitus: Secondary | ICD-10-CM | POA: Diagnosis not present

## 2016-01-15 DIAGNOSIS — O212 Late vomiting of pregnancy: Secondary | ICD-10-CM

## 2016-01-15 LAB — CBC
HEMATOCRIT: 37.1 % (ref 36.0–46.0)
Hemoglobin: 12.7 g/dL (ref 12.0–15.0)
MCH: 27 pg (ref 26.0–34.0)
MCHC: 34.2 g/dL (ref 30.0–36.0)
MCV: 78.9 fL (ref 78.0–100.0)
Platelets: 405 10*3/uL — ABNORMAL HIGH (ref 150–400)
RBC: 4.7 MIL/uL (ref 3.87–5.11)
RDW: 15.4 % (ref 11.5–15.5)
WBC: 13.9 10*3/uL — AB (ref 4.0–10.5)

## 2016-01-15 LAB — RPR: RPR: NONREACTIVE

## 2016-01-15 LAB — TYPE AND SCREEN
ABO/RH(D): O POS
ANTIBODY SCREEN: NEGATIVE

## 2016-01-15 LAB — POCT FERN TEST: POCT Fern Test: NEGATIVE

## 2016-01-15 MED ORDER — EPHEDRINE 5 MG/ML INJ
10.0000 mg | INTRAVENOUS | Status: DC | PRN
  Filled 2016-01-15: qty 2

## 2016-01-15 MED ORDER — ACETAMINOPHEN 325 MG PO TABS: 650.0000 mg | ORAL_TABLET | ORAL | Status: DC | PRN

## 2016-01-15 MED ORDER — LACTATED RINGERS IV SOLN
1.0000 m[IU]/min | INTRAVENOUS | Status: DC
  Administered 2016-01-15: 2 m[IU]/min via INTRAVENOUS

## 2016-01-15 MED ORDER — TETANUS-DIPHTH-ACELL PERTUSSIS 5-2.5-18.5 LF-MCG/0.5 IM SUSP: 0.5000 mL | Freq: Once | INTRAMUSCULAR | Status: DC

## 2016-01-15 MED ORDER — TERBUTALINE SULFATE 1 MG/ML IJ SOLN
0.2500 mg | Freq: Once | INTRAMUSCULAR | Status: DC | PRN
  Filled 2016-01-15: qty 1

## 2016-01-15 MED ORDER — ZOLPIDEM TARTRATE 5 MG PO TABS
5.0000 mg | ORAL_TABLET | Freq: Every evening | ORAL | Status: DC | PRN
Start: 2016-01-15 — End: 2016-01-17

## 2016-01-15 MED ORDER — CITRIC ACID-SODIUM CITRATE 334-500 MG/5ML PO SOLN: 30.0000 mL | ORAL | Status: DC | PRN

## 2016-01-15 MED ORDER — DIPHENHYDRAMINE HCL 25 MG PO CAPS: 25.0000 mg | ORAL_CAPSULE | Freq: Four times a day (QID) | ORAL | Status: DC | PRN

## 2016-01-15 MED ORDER — DIPHENHYDRAMINE HCL 50 MG/ML IJ SOLN: 12.5000 mg | INTRAMUSCULAR | Status: DC | PRN

## 2016-01-15 MED ORDER — PHENYLEPHRINE 40 MCG/ML (10ML) SYRINGE FOR IV PUSH (FOR BLOOD PRESSURE SUPPORT)
PREFILLED_SYRINGE | INTRAVENOUS | Status: AC
  Filled 2016-01-15: qty 20

## 2016-01-15 MED ORDER — FENTANYL 2.5 MCG/ML BUPIVACAINE 1/10 % EPIDURAL INFUSION (WH - ANES)
INTRAMUSCULAR | Status: DC
Start: 2016-01-15 — End: 2016-01-15
  Filled 2016-01-15: qty 125

## 2016-01-15 MED ORDER — OXYCODONE-ACETAMINOPHEN 5-325 MG PO TABS: 2.0000 | ORAL_TABLET | ORAL | Status: DC | PRN

## 2016-01-15 MED ORDER — PHENYLEPHRINE 40 MCG/ML (10ML) SYRINGE FOR IV PUSH (FOR BLOOD PRESSURE SUPPORT)
80.0000 ug | PREFILLED_SYRINGE | INTRAVENOUS | Status: DC | PRN
  Filled 2016-01-15: qty 2

## 2016-01-15 MED ORDER — LACTATED RINGERS IV SOLN: 500.0000 mL | Freq: Once | INTRAVENOUS | Status: DC

## 2016-01-15 MED ORDER — LACTATED RINGERS IV BOLUS (SEPSIS)
1000.0000 mL | Freq: Once | INTRAVENOUS | Status: AC
  Administered 2016-01-15: 1000 mL via INTRAVENOUS

## 2016-01-15 MED ORDER — PRENATAL MULTIVITAMIN CH
1.0000 | ORAL_TABLET | Freq: Every day | ORAL | Status: DC
  Administered 2016-01-16 – 2016-01-17 (×2): 1 via ORAL
  Filled 2016-01-15 (×2): qty 1

## 2016-01-15 MED ORDER — ONDANSETRON HCL 4 MG/2ML IJ SOLN: 4.0000 mg | Freq: Four times a day (QID) | INTRAMUSCULAR | Status: DC | PRN

## 2016-01-15 MED ORDER — LIDOCAINE HCL (PF) 1 % IJ SOLN
30.0000 mL | INTRAMUSCULAR | Status: DC | PRN
  Filled 2016-01-15: qty 30

## 2016-01-15 MED ORDER — LACTATED RINGERS IV SOLN
INTRAVENOUS | Status: DC
  Administered 2016-01-15: 02:00:00 via INTRAVENOUS

## 2016-01-15 MED ORDER — DIBUCAINE 1 % RE OINT: 1.0000 "application " | TOPICAL_OINTMENT | RECTAL | Status: DC | PRN

## 2016-01-15 MED ORDER — FENTANYL 2.5 MCG/ML BUPIVACAINE 1/10 % EPIDURAL INFUSION (WH - ANES)
14.0000 mL/h | INTRAMUSCULAR | Status: DC | PRN
  Administered 2016-01-15: 14 mL/h via EPIDURAL

## 2016-01-15 MED ORDER — WITCH HAZEL-GLYCERIN EX PADS: 1.0000 "application " | MEDICATED_PAD | CUTANEOUS | Status: DC | PRN

## 2016-01-15 MED ORDER — FLEET ENEMA 7-19 GM/118ML RE ENEM: 1.0000 | ENEMA | RECTAL | Status: DC | PRN

## 2016-01-15 MED ORDER — BENZOCAINE-MENTHOL 20-0.5 % EX AERO
1.0000 "application " | INHALATION_SPRAY | CUTANEOUS | Status: DC | PRN
  Administered 2016-01-15: 1 via TOPICAL
  Filled 2016-01-15: qty 56

## 2016-01-15 MED ORDER — IBUPROFEN 600 MG PO TABS
600.0000 mg | ORAL_TABLET | Freq: Four times a day (QID) | ORAL | Status: DC
  Administered 2016-01-15 – 2016-01-17 (×9): 600 mg via ORAL
  Filled 2016-01-15 (×9): qty 1

## 2016-01-15 MED ORDER — PROMETHAZINE HCL 25 MG/ML IJ SOLN
12.5000 mg | Freq: Once | INTRAMUSCULAR | Status: AC
  Administered 2016-01-15: 12.5 mg via INTRAVENOUS
  Filled 2016-01-15: qty 1

## 2016-01-15 MED ORDER — SIMETHICONE 80 MG PO CHEW: 80.0000 mg | CHEWABLE_TABLET | ORAL | Status: DC | PRN

## 2016-01-15 MED ORDER — BUTORPHANOL TARTRATE 1 MG/ML IJ SOLN
1.0000 mg | INTRAMUSCULAR | Status: DC | PRN
  Administered 2016-01-15 (×2): 1 mg via INTRAVENOUS
  Filled 2016-01-15 (×2): qty 1

## 2016-01-15 MED ORDER — LACTATED RINGERS IV SOLN: 500.0000 mL | INTRAVENOUS | Status: DC | PRN

## 2016-01-15 MED ORDER — COCONUT OIL OIL: 1.0000 "application " | TOPICAL_OIL | Status: DC | PRN

## 2016-01-15 MED ORDER — OXYCODONE HCL 5 MG PO TABS: 5.0000 mg | ORAL_TABLET | ORAL | Status: DC | PRN

## 2016-01-15 MED ORDER — SENNOSIDES-DOCUSATE SODIUM 8.6-50 MG PO TABS
2.0000 | ORAL_TABLET | ORAL | Status: DC
  Administered 2016-01-16 (×2): 2 via ORAL
  Filled 2016-01-15 (×2): qty 2

## 2016-01-15 MED ORDER — OXYCODONE HCL 5 MG PO TABS: 10.0000 mg | ORAL_TABLET | ORAL | Status: DC | PRN

## 2016-01-15 MED ORDER — OXYTOCIN 10 UNIT/ML IJ SOLN
2.5000 [IU]/h | INTRAMUSCULAR | Status: DC
Start: 2016-01-15 — End: 2016-01-15
  Filled 2016-01-15: qty 4

## 2016-01-15 MED ORDER — ONDANSETRON HCL 4 MG/2ML IJ SOLN
4.0000 mg | INTRAMUSCULAR | Status: DC | PRN
Start: 2016-01-15 — End: 2016-01-17

## 2016-01-15 MED ORDER — LIDOCAINE HCL (PF) 1 % IJ SOLN
INTRAMUSCULAR | Status: DC | PRN
  Administered 2016-01-15 (×2): 6 mL

## 2016-01-15 MED ORDER — OXYTOCIN BOLUS FROM INFUSION
500.0000 mL | INTRAVENOUS | Status: DC
  Administered 2016-01-15: 500 mL via INTRAVENOUS

## 2016-01-15 MED ORDER — ONDANSETRON HCL 4 MG/2ML IJ SOLN
4.0000 mg | Freq: Once | INTRAMUSCULAR | Status: AC
  Administered 2016-01-15: 4 mg via INTRAVENOUS
  Filled 2016-01-15: qty 2

## 2016-01-15 MED ORDER — OXYCODONE-ACETAMINOPHEN 5-325 MG PO TABS
1.0000 | ORAL_TABLET | ORAL | Status: DC | PRN
Start: 2016-01-15 — End: 2016-01-15

## 2016-01-15 MED ORDER — ONDANSETRON HCL 4 MG PO TABS: 4.0000 mg | ORAL_TABLET | ORAL | Status: DC | PRN

## 2016-01-15 NOTE — MAU Note (Signed)
Contractions for last 2 hours.  Every 5-10 min. Nausea, vomited with ctxs. No bleeding. Baby moving well. ? Leaking when threw up, not sure if water broke or not.

## 2016-01-15 NOTE — Progress Notes (Signed)
Patient ID: Maria SpanishJa Maria Simpson, female   DOB: 10/22/1995, 20 y.o.   MRN: [id number] Pt comfortable with epidural afeb vss FHR Category 1 with occasional mild variables  Cervix 5/90/-1 to 0  Pt making progress IUPC placed to trace contractions as difficult to trace

## 2016-01-15 NOTE — Progress Notes (Signed)
Patient ID: Maria SpanishJa [name], female   DOB: 04/26/1996, 20 y.o.   MRN: [id number] Pt resting after receiving stadol Still having some intermittent N/V afeb vss FHR category 1 with some slight decreased variability s/p stadol  Cervix c/4-5/-1 AROM  Moderate meconium  Continue to follow progress.

## 2016-01-15 NOTE — Lactation Note (Signed)
This note was copied from a baby's chart. Lactation Consultation Note  Patient Name: Maria Simpson Deah Germany NFAOZ'HToday's Date: 01/15/2016 Reason for consult: Initial assessment Infant is 9 hours old & [facility] Baby was born at 3787w0d & weighed 7+1.8# at birth. Baby has had 0 wet & 2 stool. Baby has been doing some BF & some formula via bottle. Mom was feeding baby formula when LC entered. Mom reports she tries to latch him but that he won't stay latched & seems more interested in the bottle. Encouraged mom to continue working on BF & discussed other ways to feed supplementation to him (ie, spoon). Mom reports she has flat nipples so he doesn't stay latched long. Provided mom with a hand pump - showed her how to use & clean it. Encouraged pt to try pre-pumping with hand pump to help bring her nipple out before BF. Also provided pt with handout on amounts baby needs/ feeding. Mom made aware of O/P services, breastfeeding support groups, community resources, and our phone # for post-discharge questions. Mom encouraged to feed baby 8-12 times/24 hours and with feeding cues. Mom has WIC. Mom reports no questions at this time. Encouraged mom to ask for LC at future BF to assist with latch.  Maternal Data Does the patient have breastfeeding experience prior to this delivery?: No  Feeding Feeding Type: Formula  LATCH Score/Interventions                      Lactation Tools Discussed/Used WIC Program: Yes Pump Review: Setup, frequency, and cleaning;Milk Storage   Consult Status Consult Status: Follow-up Date: 01/16/16 Follow-up type: In-patient    Oneal GroutLaura C Latayna Ritchie 01/15/2016, 6:26 PM

## 2016-01-15 NOTE — Anesthesia Preprocedure Evaluation (Addendum)
Anesthesia Evaluation  Patient identified by MRN, date of birth, ID band Patient awake    Reviewed: Allergy & Precautions, NPO status , Patient's Chart, lab work & pertinent test results  Airway Mallampati: II  TM Distance: >3 FB Neck ROM: Full    Dental no notable dental hx.    Pulmonary neg pulmonary ROS,    Pulmonary exam normal breath sounds clear to auscultation       Cardiovascular negative cardio ROS Normal cardiovascular exam Rhythm:Regular Rate:Normal     Neuro/Psych negative neurological ROS  negative psych ROS   GI/Hepatic negative GI ROS, Neg liver ROS,   Endo/Other  Morbid obesity  Renal/GU negative Renal ROS  negative genitourinary   Musculoskeletal negative musculoskeletal ROS (+)   Abdominal (+) + obese,   Peds negative pediatric ROS (+)  Hematology negative hematology ROS (+)   Anesthesia Other Findings   Reproductive/Obstetrics negative OB ROS                             Anesthesia Physical Anesthesia Plan  ASA: III  Anesthesia Plan: Epidural   Post-op Pain Management:    Induction: Intravenous  Airway Management Planned:   Additional Equipment:   Intra-op Plan:   Post-operative Plan:   Informed Consent: I have reviewed the patients History and Physical, chart, labs and discussed the procedure including the risks, benefits and alternatives for the proposed anesthesia with the patient or authorized representative who has indicated his/her understanding and acceptance.   Dental advisory given  Plan Discussed with: CRNA  Anesthesia Plan Comments: (Informed consent obtained prior to proceeding including risk of failure, 1% risk of PDPH, risk of minor discomfort and bruising.  Discussed rare but serious complications including epidural abscess, permanent nerve injury, epidural hematoma.  Discussed alternatives to epidural analgesia and patient desires to  proceed.  Timeout performed pre-procedure verifying patient name, procedure, and platelet count.  Patient tolerated procedure well. )        Anesthesia Quick Evaluation

## 2016-01-15 NOTE — MAU Provider Note (Signed)
Chief Complaint:  Contractions  Seen at 0015 hrs on 01/15/16   HPI  Maria Simpson is a 20 y.o. G1P0 at 5539w2dwho presents to maternity admissions reporting Leaking of fluid since an hour ago, feels little gushes when vomiting.    States contractions are severe. She reports good fetal movement, denies LOF, vaginal bleeding, vaginal itching/burning, urinary symptoms, h/a, dizziness, n/v, or fever/chills  RN Note: Contractions for last 2 hours. Every 5-10 min. Nausea, vomited with ctxs. No bleeding. Baby moving well. ? Leaking when threw up, not sure if water broke or not.          Past Medical History: Past Medical History  Diagnosis Date  . Urinary incontinence   . Menstrual cramps   . Medical history non-contributory     Past obstetric history: OB History  Gravida Para Term Preterm AB SAB TAB Ectopic Multiple Living  1             # Outcome Date GA Lbr Len/2nd Weight Sex Delivery Anes PTL Lv  1 Current               Past Surgical History: Past Surgical History  Procedure Laterality Date  . No past surgeries      Family History: Family History  Problem Relation Age of Onset  . Prostate cancer    . Diabetes Maternal Grandmother   . Diabetes Maternal Grandfather   . Heart disease Maternal Grandfather   . Hypertension Maternal Grandfather     Social History: Social History  Substance Use Topics  . Smoking status: Never Smoker   . Smokeless tobacco: Never Used  . Alcohol Use: No    Allergies: No Known Allergies  Meds:  Prescriptions prior to admission  Medication Sig Dispense Refill Last Dose  . Prenatal Vit-Fe Fumarate-FA (PRENATAL MULTIVITAMIN) TABS tablet Take 1 tablet by mouth daily.   01/14/2016 at Unknown time  . cephALEXin (KEFLEX) 500 MG capsule Take 1 capsule (500 mg total) by mouth 4 (four) times daily. 28 capsule 0     ROS:  Review of Systems  Constitutional: Negative for fever and chills.  Gastrointestinal: Positive for nausea, vomiting and  abdominal pain. Negative for diarrhea and constipation.  Genitourinary: Positive for vaginal discharge and pelvic pain. Negative for dysuria and vaginal bleeding.  Musculoskeletal: Positive for back pain.   I have reviewed patient's Past Medical Hx, Surgical Hx, Family Hx, Social Hx, medications and allergies.   Physical Exam  Patient Vitals for the past 24 hrs:  BP Temp Temp src Pulse Resp  01/15/16 0009 118/74 mmHg 98.7 F (37.1 C) Oral 96 20   Constitutional: Well-developed, well-nourished female in no acute distress.  Cardiovascular: normal rate and rhythm Respiratory: normal effort, no distress GI: Abd soft, non-tender, gravid appropriate for gestational age.  MS: Extremities nontender, no edema, normal ROM Neurologic: Alert and oriented x 4.  GU: Neg CVAT.  PELVIC EXAM: Cervix pink, visually closed, without lesion, scant yellow and white mucous discharge, vaginal walls and external genitalia normal Bimanual exam: Cervix 1-2/80/-2/vertex, anterior, neg CMT, uterus nontender, adnexa without tenderness, enlargement, or mass  No Pooling, no fernig    FHT:  Baseline 140 , moderate variability, accelerations present, no decelerations Contractions: q 2-4 mins, irregular   Labs: No results found for this or any previous visit (from the past 24 hour(s)). --/--/O POS (09/11 1715)  Imaging:  No results found.  MAU Course/MDM: I have Done a speculum exam and fern test which was  Negative.  Pooling was negative Nitrazine not done.   Consult Dr Senaida Ores called by RN.     Assessment: Prodromal vs latent labor  Plan: Disposition per Dr Senaida Ores     Medication List    ASK your doctor about these medications        cephALEXin 500 MG capsule  Commonly known as:  KEFLEX  Take 1 capsule (500 mg total) by mouth 4 (four) times daily.     prenatal multivitamin Tabs tablet  Take 1 tablet by mouth daily.        Wynelle Bourgeois CNM, MSN Certified  Nurse-Midwife 01/15/2016 12:27 AM

## 2016-01-15 NOTE — Anesthesia Postprocedure Evaluation (Signed)
Anesthesia Post Note  Patient: Maria Simpson  Procedure(s) Performed: * No procedures listed *  Patient location during evaluation: Mother Baby Anesthesia Type: Epidural Level of consciousness: awake and alert Pain management: pain level controlled Vital Signs Assessment: post-procedure vital signs reviewed and stable Respiratory status: spontaneous breathing Cardiovascular status: stable Postop Assessment: patient able to bend at knees, no signs of nausea or vomiting and adequate PO intake Anesthetic complications: no    Last Vitals:  Filed Vitals:   01/15/16 1002 01/15/16 1155  BP: 103/61 123/57  Pulse: 72 74  Temp:  36.8 C  Resp:  18    Last Pain:  Filed Vitals:   01/15/16 1231  PainSc: 0-No pain                 Mustafa Potts Hristova

## 2016-01-15 NOTE — H&P (Signed)
Maria Simpson is a 20 y.o. female G1P0 at 5239 0/7 weeks (EDD 01/22/16 by LMP c/Simpson 8 week US) presenting for onset of labor with strong contractions and significant N/V with them.  Prenatal care uncomplicated.  Maternal Medical History:  Reason for admission: Contractions and nausea.   Contractions: Onset was 3-5 hours ago.   Frequency: regular.   Perceived severity is strong.    Fetal activity: Perceived fetal activity is normal.    Prenatal Complications - Diabetes: none.    OB History    Gravida Para Term Preterm AB TAB SAB Ectopic Multiple Living   1              Past Medical History  Diagnosis Date  . Urinary incontinence   . Menstrual cramps   . Medical history non-contributory    Past Surgical History  Procedure Laterality Date  . No past surgeries     Family History: family history includes Diabetes in her maternal grandfather and maternal grandmother; Heart disease in her maternal grandfather; Hypertension in her maternal grandfather. Social History:  reports that she has never smoked. She has never used smokeless tobacco. She reports that she does not drink alcohol or use illicit drugs.   Prenatal Transfer Tool  Maternal Diabetes: No Genetic Screening: Normal Maternal Ultrasounds/Referrals: Normal Fetal Ultrasounds or other Referrals:  None Maternal Substance Abuse:  No Significant Maternal Medications:  None Significant Maternal Lab Results:  None Other Comments:  None  Review of Systems  Gastrointestinal: Positive for nausea, vomiting and abdominal pain.    Dilation: 4 Effacement (%): 80 Station: -2 Exam by:: Maria Simpson Blood pressure 118/74, pulse 96, temperature 98.7 F (37.1 C), temperature source Oral, resp. rate 20, last menstrual period 04/05/2015. Maternal Exam:  Uterine Assessment: Contraction strength is firm.  Contraction frequency is regular.   Abdomen: Patient reports no abdominal tenderness. Fetal presentation: vertex  Introitus:  Normal vulva. Normal vagina.    Physical Exam  Constitutional: She appears well-developed and well-nourished.  Cardiovascular: Normal rate and regular rhythm.   Respiratory: Effort normal.  GI: Soft.  Genitourinary: Vagina normal.  Neurological: She is alert.  Psychiatric: She has a normal mood and affect.    Prenatal labs: ABO, Rh: O/Positive/-- (10/25 0000) Antibody: Negative (10/25 0000) Rubella: Immune (10/25 0000) RPR: Nonreactive (10/25 0000)  HBsAg: Negative (10/25 0000)  HIV: Non-reactive (10/25 0000)  GBS: Negative (03/23 0000)  One hour GCT 77 CF negative Hgb AA  Assessment/Plan: Pt progressed to 4cm from 1.5cm in MAU.  N/V treated with zofran, hydration and phenergan. Will admit to L&D when bed available.  FHR category1.   Maria Simpson 01/15/2016, 2:11 AM

## 2016-01-15 NOTE — Progress Notes (Signed)
Patient ID: Maria SpanishJa Maria Simpson, female   DOB: 03/25/1996, 20 y.o.   MRN: [id number] Pt feeling pressure.  FHR with mild variables Cervix c/c/+2  Will begin pushing as soon as FOB arrives per pt request.

## 2016-01-15 NOTE — Anesthesia Procedure Notes (Signed)
Epidural Patient location during procedure: OB  Staffing Anesthesiologist: Andree Heeg  Preanesthetic Checklist Completed: patient identified, site marked, surgical consent, pre-op evaluation, timeout performed, IV checked, risks and benefits discussed and monitors and equipment checked  Epidural Patient position: sitting Prep: DuraPrep Patient monitoring: heart rate and blood pressure Approach: midline Location: L4-L5 Injection technique: LOR air  Needle:  Needle type: Tuohy  Needle gauge: 17 G Needle insertion depth: 7 cm Catheter type: closed end flexible Catheter size: 19 Gauge Catheter at skin depth: 13 cm Test dose: negative and Other  Assessment Events: blood not aspirated, injection not painful, no injection resistance, negative IV test and no paresthesia  Additional Notes Reason for block:procedure for pain   

## 2016-01-16 ENCOUNTER — Encounter (HOSPITAL_COMMUNITY): Payer: Self-pay | Admitting: *Deleted

## 2016-01-16 LAB — CBC
HEMATOCRIT: 33.5 % — AB (ref 36.0–46.0)
HEMOGLOBIN: 11.2 g/dL — AB (ref 12.0–15.0)
MCH: 27.1 pg (ref 26.0–34.0)
MCHC: 33.4 g/dL (ref 30.0–36.0)
MCV: 80.9 fL (ref 78.0–100.0)
Platelets: 354 10*3/uL (ref 150–400)
RBC: 4.14 MIL/uL (ref 3.87–5.11)
RDW: 15.6 % — ABNORMAL HIGH (ref 11.5–15.5)
WBC: 17.3 10*3/uL — AB (ref 4.0–10.5)

## 2016-01-16 NOTE — Progress Notes (Signed)
Post Partum Day 1 Subjective: no complaints and tolerating PO  Objective: Blood pressure 125/74, pulse 72, temperature 98.3 F (36.8 C), temperature source Oral, resp. rate 18, height 5\' 7"  (1.702 m), weight 115.667 kg (255 lb), last menstrual period 04/05/2015, SpO2 100 %, unknown if currently breastfeeding.  Physical Exam:  General: alert and cooperative Lochia: appropriate Uterine Fundus: firm    Recent Labs  01/15/16 0136 01/16/16 0625  HGB 12.7 11.2*  HCT 37.1 33.5*    Assessment/Plan: Plan for discharge tomorrow   LOS: 1 day   Maria Simpson W 01/16/2016, 10:13 AM

## 2016-01-16 NOTE — Discharge Summary (Signed)
OB Discharge Summary     Patient Name: Maria Simpson DOB: Feb 03, 1996 MRN: [id number]  Date of admission: 01/14/2016 Delivering MD: Huel Cote   Date of discharge: 01/16/2016  Admitting diagnosis: 39 wks ctx every 5 to 10 min Intrauterine pregnancy: [redacted]w[redacted]d     Secondary diagnosis:  Active Problems:   Normal labor   NSVD (normal spontaneous vaginal delivery)  Additional problems: none     Discharge diagnosis: Term Pregnancy Delivered                                                                                                Post partum procedures:none  Augmentation: AROM and pitocin  Complications: None  Hospital course:  Onset of Labor With Vaginal Delivery     20 y.o. yo G1P1001 at [redacted]w[redacted]d was admitted in Latent Labor on 01/14/2016. Patient had an uncomplicated labor course as follows:  Membrane Rupture Time/Date: 4:46 AM ,01/15/2016   Intrapartum Procedures: Episiotomy: None [1]                                         Lacerations:  1st degree [2];Labial [10]  Patient had a delivery of a Viable infant. 01/15/2016  Information for the patient's newborn:  Kierstyn, Baranowski [[id number]]  Delivery Method: Vaginal, Spontaneous Delivery (Filed from Delivery Summary)    Pateint had an uncomplicated postpartum course.  She is ambulating, tolerating a regular diet, passing flatus, and urinating well. Patient is discharged home in stable condition on 01/16/2016.    Physical exam  Filed Vitals:   01/15/16 1155 01/15/16 1255 01/15/16 1655 01/16/16 0619  BP: 123/57 115/58 116/67 125/74  Pulse: 74 64 76 72  Temp: 98.3 F (36.8 C) 98.5 F (36.9 C) 98.5 F (36.9 C) 98.3 F (36.8 C)  TempSrc: Oral Oral Oral Oral  Resp: Height:      Weight:      SpO2: 100% 100% 100% 100%   General: alert and cooperative Lochia: appropriate Uterine Fundus: firm  Labs: Lab Results  Component Value Date   WBC 17.3* 01/16/2016   HGB 11.2* 01/16/2016   HCT 33.5*  01/16/2016   MCV 80.9 01/16/2016   PLT 354 01/16/2016   CMP Latest Ref Rng 02/18/2014  Glucose 70 - 99 mg/dL 73  BUN 6 - 23 mg/dL 7  Creatinine 0.4 - 1.2 mg/dL 0.8  Sodium 638 - 756 mEq/L 139  Potassium 3.5 - 5.1 mEq/L 3.9  Chloride 96 - 112 mEq/L 103  CO2 19 - 32 mEq/L 28  Calcium 8.4 - 10.5 mg/dL 9.2  Total Protein 6.0 - 8.3 g/dL 7.5  Total Bilirubin 0.2 - 0.8 mg/dL 0.4  Alkaline Phos 47 - 119 U/L 70  AST 0 - 37 U/L 16  ALT 0 - 35 U/L 13    Discharge instruction: per After Visit Summary and "Baby and Me Booklet".  After visit meds:    Medication List    ASK your doctor about these medications  prenatal multivitamin Tabs tablet  Take 1 tablet by mouth daily.        Diet: routine diet  Activity: Advance as tolerated. Pelvic rest for 6 weeks.   Outpatient follow up:6 weeks Follow up Appt:No future appointments. Follow up Visit:No Follow-up on file.  Postpartum contraception: Nexplanon discussed, pt needs to call and sign for this and is aware  Newborn Data: Live born female  Birth Weight: 7 lb 1.8 oz (3225 g) APGAR: 9, 9  Baby Feeding: Breast Disposition:home with mother, plans circumcision in the office, will call to pay and set up   01/16/2016 [name],Lear Carstens W, MD

## 2016-01-17 MED ORDER — IBUPROFEN 600 MG PO TABS: 600.0000 mg | ORAL_TABLET | Freq: Four times a day (QID) | ORAL | Status: DC

## 2016-01-17 NOTE — Progress Notes (Signed)
Post Partum Day 2 Subjective: no complaints and tolerating PO  Objective: Blood pressure 141/73, pulse 68, temperature 97.8 F (36.6 C), temperature source Oral, resp. rate 20, height 5\' 7"  (1.702 m), weight 115.667 kg (255 lb), last menstrual period 04/05/2015, SpO2 100 %, unknown if currently breastfeeding.  Physical Exam:  General: alert and cooperative Lochia: appropriate Uterine Fundus: firm    Recent Labs  01/15/16 0136 01/16/16 0625  HGB 12.7 11.2*  HCT 37.1 33.5*    Assessment/Plan: Discharge home  Motrin   LOS: 2 days   Saed Hudlow W 01/17/2016, 9:25 AM

## 2016-08-08 ENCOUNTER — Telehealth: Payer: Self-pay | Admitting: Family Medicine

## 2016-08-08 ENCOUNTER — Encounter: Payer: Self-pay | Admitting: Family Medicine

## 2016-08-08 NOTE — Telephone Encounter (Signed)
Mailed letter to patient informing that Dr. Zola ButtonLowne-Chase will be out of the office on September 22, 2016 and her appointment needs to be rescheduled. Appointment cancelled

## 2016-08-15 NOTE — Telephone Encounter (Signed)
Attempted to call patient to reschedule 12/22 appointment. Telephone number not in service.

## 2016-09-22 ENCOUNTER — Encounter: Payer: 59 | Admitting: Family Medicine

## 2016-10-02 NOTE — L&D Delivery Note (Signed)
Patient is a 21 y.o. now [id number] s/p NSVD at 286w4d, who was admitted for PROM at 2200 last night. S/p IOL with Pitocin. GBS neg.   Delivery Note At 7:14 AM a viable female was delivered via Vaginal, Spontaneous (Presentation: Vertex;  LOA).  APGAR: 8, 9; weight  pending.   Placenta status: intact.  Cord:  3-vessel  Anesthesia:  Epidural Episiotomy: None Lacerations:  Right labial, first degree perineal Suture Repair: 3.0 vicryl Est. Blood Loss (mL):  400  Mom to postpartum.  Baby to Couplet care / Skin to Skin.  Raynelle FanningJulie P. Arilla Hice, MD OB Fellow 10/01/17, 7:46 AM

## 2016-10-09 ENCOUNTER — Ambulatory Visit (INDEPENDENT_AMBULATORY_CARE_PROVIDER_SITE_OTHER): Payer: 59 | Admitting: Family Medicine

## 2016-10-09 ENCOUNTER — Telehealth: Payer: Self-pay | Admitting: Family Medicine

## 2016-10-09 ENCOUNTER — Encounter: Payer: Self-pay | Admitting: Family Medicine

## 2016-10-09 VITALS — BP 90/62 | HR 82 | Temp 98.0°F | Resp 16 | Ht 67.0 in | Wt 266.0 lb

## 2016-10-09 DIAGNOSIS — Z309 Encounter for contraceptive management, unspecified: Secondary | ICD-10-CM | POA: Diagnosis not present

## 2016-10-09 NOTE — Progress Notes (Signed)
Pre visit review using our clinic review tool, if applicable. No additional management support is needed unless otherwise documented below in the visit note. 

## 2016-10-09 NOTE — Progress Notes (Signed)
Subjective:    Patient ID: Maria Simpson, female    DOB: 10-18-1995, 21 y.o.   MRN: [id number]  Chief Complaint  Patient presents with  . Acute Visit    pt is here to discuss birth control, pt report she was on a pill 2 years ago.Marland Kitchen    HPI Patient is in today for to discuss birth control.  Pt was on the pills in past and would like to have nexplanon.  She has many friends that have it and she has read about it.  She understands risks involved.    Past Medical History:  Diagnosis Date  . Medical history non-contributory   . Menstrual cramps   . Urinary incontinence     Past Surgical History:  Procedure Laterality Date  . NO PAST SURGERIES      Family History  Problem Relation Age of Onset  . Prostate cancer    . Diabetes Maternal Grandmother   . Diabetes Maternal Grandfather   . Heart disease Maternal Grandfather   . Hypertension Maternal Grandfather     Social History   Social History  . Marital status: Single    Spouse name: N/A  . Number of children: N/A  . Years of education: N/A   Occupational History  . Not on file.   Social History Main Topics  . Smoking status: Never Smoker  . Smokeless tobacco: Never Used  . Alcohol use No  . Drug use: No  . Sexual activity: Yes    Birth control/ protection: None   Other Topics Concern  . Not on file   Social History Narrative   Exercise--- softball    Outpatient Medications Prior to Visit  Medication Sig Dispense Refill  . ibuprofen (ADVIL,MOTRIN) 600 MG tablet Take 1 tablet (600 mg total) by mouth every 6 (six) hours. (Patient not taking: Reported on 10/09/2016) 30 tablet 0  . Prenatal Vit-Fe Fumarate-FA (PRENATAL MULTIVITAMIN) TABS tablet Take 1 tablet by mouth daily.      No facility-administered medications prior to visit.     No Known Allergies  Review of Systems  Constitutional: Negative for fever.  HENT: Negative for congestion.   Eyes: Negative for blurred vision.  Respiratory: Negative for  cough.   Cardiovascular: Negative for chest pain and palpitations.  Gastrointestinal: Negative for vomiting.  Musculoskeletal: Negative for back pain.  Skin: Negative for rash.  Neurological: Negative for loss of consciousness and headaches.       Objective:    Physical Exam  Constitutional: She is oriented to person, place, and time. She appears well-developed and well-nourished. No distress.  HENT:  Head: Normocephalic and atraumatic.  Eyes: Conjunctivae are normal. Pupils are equal, round, and reactive to light.  Neck: Normal range of motion. No thyromegaly present.  Cardiovascular: Normal rate and regular rhythm.   Pulmonary/Chest: Effort normal and breath sounds normal. She has no wheezes.  Abdominal: Soft. Bowel sounds are normal. There is no tenderness.  Musculoskeletal: Normal range of motion. She exhibits no edema or deformity.  Neurological: She is alert and oriented to person, place, and time.  Skin: Skin is warm and dry. She is not diaphoretic.  Psychiatric: She has a normal mood and affect.    BP 90/62 (BP Location: Left Arm, Patient Position: Sitting, Cuff Size: Large)   Pulse 82   Temp 98 F (36.7 C) (Oral)   Resp 16   Ht 5\' 7"  (1.702 m)   Wt 266 lb (120.7 kg)   LMP 09/15/2016 (  Approximate)   SpO2 94%   BMI 41.66 kg/m  Wt Readings from Last 3 Encounters:  10/09/16 266 lb (120.7 kg)  01/15/16 255 lb (115.7 kg) (>99 %, Z > 2.33)*  06/13/15 237 lb 3.2 oz (107.6 kg) (>99 %, Z > 2.33)*   * Growth percentiles are based on CDC 2-20 Years data.     Lab Results  Component Value Date   WBC 17.3 (H) 01/16/2016   HGB 11.2 (L) 01/16/2016   HCT 33.5 (L) 01/16/2016   PLT 354 01/16/2016   GLUCOSE 73 02/18/2014   CHOL 142 02/18/2014   TRIG 44.0 02/18/2014   HDL 55.90 02/18/2014   LDLCALC 77 02/18/2014   ALT 13 02/18/2014   AST 16 02/18/2014   NA 139 02/18/2014   K 3.9 02/18/2014   CL 103 02/18/2014   CREATININE 0.8 02/18/2014   BUN 7 02/18/2014   CO2 28  02/18/2014   TSH 0.83 02/18/2014    Lab Results  Component Value Date   TSH 0.83 02/18/2014   Lab Results  Component Value Date   WBC 17.3 (H) 01/16/2016   HGB 11.2 (L) 01/16/2016   HCT 33.5 (L) 01/16/2016   MCV 80.9 01/16/2016   PLT 354 01/16/2016   Lab Results  Component Value Date   NA 139 02/18/2014   K 3.9 02/18/2014   CO2 28 02/18/2014   GLUCOSE 73 02/18/2014   BUN 7 02/18/2014   CREATININE 0.8 02/18/2014   BILITOT 0.4 02/18/2014   ALKPHOS 70 02/18/2014   AST 16 02/18/2014   ALT 13 02/18/2014   PROT 7.5 02/18/2014   ALBUMIN 3.5 02/18/2014   CALCIUM 9.2 02/18/2014   GFR 118.96 02/18/2014   Lab Results  Component Value Date   CHOL 142 02/18/2014   Lab Results  Component Value Date   HDL 55.90 02/18/2014   Lab Results  Component Value Date   LDLCALC 77 02/18/2014   Lab Results  Component Value Date   TRIG 44.0 02/18/2014   Lab Results  Component Value Date   CHOLHDL 3 02/18/2014   No results found for: HGBA1C     Assessment & Plan:   Problem List Items Addressed This Visit    None    Visit Diagnoses    Encounter for contraceptive management, unspecified type    -  Primary   Relevant Orders   POCT urine pregnancy     pt to rto for nexplanon  Call office day period starts  I am having Ms. Antone maintain her prenatal multivitamin and ibuprofen.  No orders of the defined types were placed in this encounter.   CMA served as Neurosurgeonscribe during this visit. History, Physical and Plan performed by medical provider. Documentation and orders reviewed and attested to.   Donato SchultzYvonne R Lowne Chase, DO

## 2016-10-09 NOTE — Patient Instructions (Signed)
Contraceptive Implant Information A contraceptive implant is a plastic rod that is inserted under your skin. It is usually inserted under the skin of your upper arm. It continually releases small amounts of progestin (synthetic progesterone) into your bloodstream. This prevents an egg from being released from your ovaries. It also thickens your cervical mucus to prevent sperm from entering the cervix, and it thins your uterine lining to prevent a fertilized egg from attaching to your uterus. Contraceptive implants can be effective for up to 3 years. They do not provide protection against sexually transmitted diseases (STDs).  The procedure to insert an implant usually takes about 10 minutes. There may be minor bruising, swelling, and discomfort at the insertion site for a couple days. The implant begins to work within the first day. Other contraceptive protection may be necessary for 7 days. Be sure to discuss with your health care provider if you need a backup method of contraception.  Your health care provider will make sure you are a good candidate for the contraceptive implant. Discuss with your health care provider the possible side effects of the implant. ADVANTAGES  It prevents pregnancy for up to 3 years.  It is easily reversible.  It is convenient.  It can be used when breastfeeding.  It can be used by women who cannot take estrogen. DISADVANTAGES  You may have irregular or unplanned vaginal bleeding.  You may develop side effects, including headache, weight gain, acne, breast tenderness, or mood changes.  You may have tissue or nerve damage after insertion (rare).  It may be difficult and uncomfortable to remove.  Certain medicines may interfere with the effectiveness of the implant. REMOVAL OF IMPLANT The implant should be removed in 3 years or as directed by your health care provider. The implant's effect wears off in a few hours after removal. Your ability to get pregnant  (fertility) may be restored in 1-2 weeks. A new implant can be inserted as soon as the old one is removed if desired. CONTRAINDICATIONS You should not get the implant if you are experiencing any of the following situations:  You are pregnant.  You have a history of breast cancer, osteoporosis, blood clots, heart disease, diabetes, high blood pressure, liver disease, tumors, or stroke.   You have undiagnosed vaginal bleeding.  You have a sensitivity to any part of the implant. This information is not intended to replace advice given to you by your health care provider. Make sure you discuss any questions you have with your health care provider. Document Released: 09/07/2011 Document Revised: 05/21/2013 Document Reviewed: 03/17/2013 Elsevier Interactive Patient Education  2017 [employer]  

## 2016-10-09 NOTE — Telephone Encounter (Signed)
Return nexplanon30 min appointment scheduled for 10/23/16 at 11:00. Acute and Same day slot used. Dr. Carmelia RollerWendling is in the office that day.

## 2016-10-09 NOTE — Telephone Encounter (Signed)
Return nexplanon  30 min appointment scheduled for 10/23/16 at 11:00. Acute and Same day slot used. Dr. Wendling is in the office that day.  °

## 2016-10-10 ENCOUNTER — Telehealth: Payer: Self-pay | Admitting: Family Medicine

## 2016-10-10 LAB — POCT URINE PREGNANCY: PREG TEST UR: NEGATIVE

## 2016-10-10 NOTE — Telephone Encounter (Signed)
error:[id number] ° °

## 2016-10-10 NOTE — Telephone Encounter (Signed)
Patient called stating that her cycle started today. She stated that she needed to come in sooner than the 1/022/18 appointment due to this. She is scheduled in the next available appointment when Dr. Carmelia RollerWendling is in the office which is 10/19/16. She is curious as to if her cycle ends before then, does she need to reschedule? Please advise

## 2016-10-10 NOTE — Telephone Encounter (Signed)
Patient called stating that her cycle started today. She stated that she needed to come in sooner than the 1/022/18 appointment due to this. She is scheduled in the next available appointment when Dr. Wendling is in the office which is 10/19/16. She is curious as to if her cycle ends before then, does she need to reschedule? Please advise ° °

## 2016-10-17 NOTE — Telephone Encounter (Signed)
If dr Gerrit HeckWendlling is here Thursday ok to schedule for thursday

## 2016-10-19 ENCOUNTER — Ambulatory Visit: Payer: 59 | Admitting: Family Medicine

## 2016-10-23 ENCOUNTER — Ambulatory Visit: Payer: 59 | Admitting: Family Medicine

## 2017-05-17 ENCOUNTER — Telehealth: Payer: Self-pay | Admitting: Family Medicine

## 2017-05-17 NOTE — Telephone Encounter (Signed)
Caller name: Relationship to patient: Can be reached: Pharmacy:  Reason for call:Requesting referral

## 2017-05-18 ENCOUNTER — Encounter: Payer: Self-pay | Admitting: Medical

## 2017-05-18 ENCOUNTER — Ambulatory Visit (INDEPENDENT_AMBULATORY_CARE_PROVIDER_SITE_OTHER): Payer: 59 | Admitting: Medical

## 2017-05-18 VITALS — BP 111/48 | HR 99 | Temp 98.8°F | Resp 18 | Wt 265.0 lb

## 2017-05-18 DIAGNOSIS — Z87440 Personal history of urinary (tract) infections: Secondary | ICD-10-CM | POA: Diagnosis not present

## 2017-05-18 DIAGNOSIS — R309 Painful micturition, unspecified: Secondary | ICD-10-CM

## 2017-05-18 DIAGNOSIS — Z3A18 18 weeks gestation of pregnancy: Secondary | ICD-10-CM

## 2017-05-18 DIAGNOSIS — Z3A20 20 weeks gestation of pregnancy: Secondary | ICD-10-CM

## 2017-05-18 NOTE — Progress Notes (Signed)
Subjective:    Patient ID: Maria Simpson, female    DOB: [birth year], 21 y.o.   MRN: [id number]  HPI  Pt in for referral to OB.   Pt is 18 weeks. 2 weeks ago went to ED in [place]. She had sharp abd pains.  She gave a urine sample and then given some medications. Pain did subside. She gave [facility] ([facility] hospital report of pain on urination before she was seen. She now feels occasional twinge but no constant pain.(no symptoms presently) No cramping. No vaginal bleeding. No fever, no chills. Now completely no uti symptoms.  Pt has one year old child. He was born 39 weeks. Not complications with that pregnancy.   In care everywhere I don't see a culture of urine.   Pt is on prenatal vitamins.   Review of Systems  Constitutional: Negative for chills, fatigue and fever.  Respiratory: Negative for cough, chest tightness, shortness of breath and wheezing.   Cardiovascular: Negative for chest pain and palpitations.  Gastrointestinal: Negative for abdominal distention and anal bleeding.  Musculoskeletal: Negative for back pain.  Neurological: Negative for dizziness, numbness and headaches.  Hematological: Negative for adenopathy. Does not bruise/bleed easily.  Psychiatric/Behavioral: Negative for behavioral problems and confusion.    Past Medical History:  Diagnosis Date  . Medical history non-contributory   . Menstrual cramps   . Urinary incontinence      Social History   Social History  . Marital status: Single    Spouse name: N/A  . Number of children: N/A  . Years of education: N/A   Occupational History  . Not on file.   Social History Main Topics  . Smoking status: Never Smoker  . Smokeless tobacco: Never Used  . Alcohol use No  . Drug use: No  . Sexual activity: Yes    Birth control/ protection: None   Other Topics Concern  . Not on file   Social History Narrative   Exercise--- softball    Past Surgical History:  Procedure Laterality Date  . NO PAST  SURGERIES      Family History  Problem Relation Age of Onset  . Prostate cancer Unknown   . Diabetes Maternal Grandmother   . Diabetes Maternal Grandfather   . Heart disease Maternal Grandfather   . Hypertension Maternal Grandfather     No Known Allergies  Current Outpatient Prescriptions on File Prior to Visit  Medication Sig Dispense Refill  . ibuprofen (ADVIL,MOTRIN) 600 MG tablet Take 1 tablet (600 mg total) by mouth every 6 (six) hours. (Patient not taking: Reported on 10/09/2016) 30 tablet 0  . Prenatal Vit-Fe Fumarate-FA (PRENATAL MULTIVITAMIN) TABS tablet Take 1 tablet by mouth daily.      No current facility-administered medications on file prior to visit.     BP (!) 111/48 (BP Location: Right Arm, Patient Position: Sitting, Cuff Size: Normal)   Pulse 99   Temp 98.8 F (37.1 C) (Oral)   Resp 18   Wt 265 lb (120.2 kg)   LMP 09/15/2016 (Approximate)   SpO2 100%   BMI 41.50 kg/m       Objective:   Physical Exam   General- No acute distress. Pleasant patient. Neck- Full range of motion, no jvd Lungs- Clear, even and unlabored. Heart- regular rate and rhythm. Neurologic- CNII- XII grossly intact.  Abdomen- soft, non tenderness.  Back- no cva pain.     Assessment & Plan:  For your pregnancy I put in OB referral. But I want you  to walk down to their office and see them today to get scheduled.  For possible recent uti will get urine culture today and follow if any infection.  Follow up with OB or with [place] as needed

## 2017-05-18 NOTE — Patient Instructions (Signed)
For your pregnancy I put in OB referral. But I want you to walk down to their office and see them today to get scheduled.  For possible recent uti will get urine culture today and follow if any infection.  Follow up with OB or with [place] as needed

## 2017-05-19 LAB — URINE CULTURE

## 2017-05-22 DIAGNOSIS — O0930 Supervision of pregnancy with insufficient antenatal care, unspecified trimester: Secondary | ICD-10-CM | POA: Insufficient documentation

## 2017-05-24 ENCOUNTER — Ambulatory Visit (INDEPENDENT_AMBULATORY_CARE_PROVIDER_SITE_OTHER): Payer: 59 | Admitting: Family Medicine

## 2017-05-24 ENCOUNTER — Encounter: Payer: Self-pay | Admitting: Family Medicine

## 2017-05-24 ENCOUNTER — Other Ambulatory Visit (HOSPITAL_COMMUNITY)
Admission: RE | Admit: 2017-05-24 | Discharge: 2017-05-24 | Disposition: A | Discharge status: Home / Self Care | Payer: 59 | Source: Ambulatory Visit | Attending: Family Medicine | Admitting: Family Medicine

## 2017-05-24 VITALS — BP 122/78 | HR 94 | Wt 263.0 lb

## 2017-05-24 DIAGNOSIS — Z3689 Encounter for other specified antenatal screening: Secondary | ICD-10-CM | POA: Diagnosis not present

## 2017-05-24 DIAGNOSIS — Z3480 Encounter for supervision of other normal pregnancy, unspecified trimester: Secondary | ICD-10-CM | POA: Insufficient documentation

## 2017-05-24 DIAGNOSIS — Z3A21 21 weeks gestation of pregnancy: Secondary | ICD-10-CM | POA: Diagnosis not present

## 2017-05-24 DIAGNOSIS — O99212 Obesity complicating pregnancy, second trimester: Secondary | ICD-10-CM

## 2017-05-24 DIAGNOSIS — Z3482 Encounter for supervision of other normal pregnancy, second trimester: Secondary | ICD-10-CM

## 2017-05-24 DIAGNOSIS — E669 Obesity, unspecified: Secondary | ICD-10-CM

## 2017-05-24 NOTE — Progress Notes (Signed)
  Subjective:  Maria Simpson is a G2P1001 [redacted]w[redacted]d being seen today for her first obstetrical visit.  Her obstetrical history is significant for obesity. This is an unplanned pregnancy. Patient does intend to breast feed. Pregnancy history fully reviewed.  Patient reports no complaints.  BP 122/78   Pulse 94   Wt 263 lb (119.3 kg)   LMP 01/22/2017 (LMP Unknown)   BMI 41.19 kg/m   HISTORY: OB History  Gravida Para Term Preterm AB Living  2 1 1     1   SAB TAB Ectopic Multiple Live Births        0 1    # Outcome Date GA Lbr Len/2nd Weight Sex Delivery Anes PTL Lv  2 Current           1 Term 01/15/16 [redacted]w[redacted]d 11:24 / 00:37 7 lb 1.8 oz (3.225 kg) M Vag-Spont EPI  LIV     Birth Comments: WNL      Past Medical History:  Diagnosis Date  . Medical history non-contributory   . Menstrual cramps   . Urinary incontinence     Past Surgical History:  Procedure Laterality Date  . NO PAST SURGERIES      Family History  Problem Relation Age of Onset  . Prostate cancer Unknown   . Diabetes Maternal Grandmother   . Diabetes Maternal Grandfather   . Heart disease Maternal Grandfather   . Hypertension Maternal Grandfather   . Cancer Neg Hx      Exam    Uterus:     Pelvic Exam:    Perineum: No Hemorrhoids, Normal Perineum   Vulva: normal, Bartholin's, Urethra, Skene's normal   Vagina:  normal mucosa   Cervix: multiparous appearance   Adnexa: normal adnexa and no mass, fullness, tenderness   Bony Pelvis: gynecoid  System: Breast:  normal appearance, no masses or tenderness   Skin: normal coloration and turgor, no rashes    Neurologic: oriented, normal, gait normal; reflexes normal and symmetric   Extremities: normal strength, tone, and muscle mass, no deformities, no erythema, induration, or nodules, ROM of all joints is normal   HEENT PERRLA and extra ocular movement intact   Mouth/Teeth mucous membranes moist, pharynx normal without lesions   Neck supple and no masses   Cardiovascular: regular rate and rhythm, no murmurs or gallops   Respiratory:  appears well, vitals normal, no respiratory distress, acyanotic, normal RR, ear and throat exam is normal, neck free of mass or lymphadenopathy, chest clear, no wheezing, crepitations, rhonchi, normal symmetric air entry   Abdomen: soft, non-tender; bowel sounds normal; no masses,  no organomegaly   Urinary: urethral meatus normal      Assessment:    Pregnancy: G2P1001 Patient Active Problem List   Diagnosis Date Noted  . Supervision of other normal pregnancy, antepartum 05/24/2017      Plan:   1. Encounter for supervision of other normal pregnancy in second trimester Genetic Screening discussed Quad Screen: ordered.  Ultrasound discussed; fetal survey: ordered.  Follow up in 4 weeks.  - GC/Chlamydia probe amp ([place])not at [facility] - Obstetric Panel, Including HIV - Sickle Cell Scr - CULTURE, URINE COMPREHENSIVE - [place] MFM OB DETAIL +14 WK; Future - HgB A1c  2. Obesity HgA1c  Problem list reviewed and updated. 75% of 30 min visit spent on counseling and coordination of care.    Levie Heritage 05/24/2017

## 2017-05-24 NOTE — Progress Notes (Signed)
Patient unsure of LMP. Patient reports feeling movement about a month ago. Beside ultrasound completed and Femur Length consistent with 21 weeks. Fetal heart rate 150 Bpm. Patient scheduled for anatomy ultrasound on . Ultrasound done for dating purposes.  Armandina Stammer RN BSN

## 2017-05-25 LAB — OBSTETRIC PANEL, INCLUDING HIV
ANTIBODY SCREEN: NEGATIVE
Basophils Absolute: 0 10*3/uL (ref 0.0–0.2)
Basos: 0 %
EOS (ABSOLUTE): 0.1 10*3/uL (ref 0.0–0.4)
Eos: 1 %
HEMOGLOBIN: 11.1 g/dL (ref 11.1–15.9)
HEP B S AG: NEGATIVE
HIV SCREEN 4TH GENERATION: NONREACTIVE
Hematocrit: 34 % (ref 34.0–46.6)
Immature Grans (Abs): 0 10*3/uL (ref 0.0–0.1)
Immature Granulocytes: 0 %
Lymphocytes Absolute: 3.4 10*3/uL — ABNORMAL HIGH (ref 0.7–3.1)
Lymphs: 29 %
MCH: 26.2 pg — ABNORMAL LOW (ref 26.6–33.0)
MCHC: 32.6 g/dL (ref 31.5–35.7)
MCV: 80 fL (ref 79–97)
MONOCYTES: 5 %
MONOS ABS: 0.6 10*3/uL (ref 0.1–0.9)
NEUTROS ABS: 7.6 10*3/uL — AB (ref 1.4–7.0)
Neutrophils: 65 %
PLATELETS: 414 10*3/uL — AB (ref 150–379)
RBC: 4.23 x10E6/uL (ref 3.77–5.28)
RDW: 15.9 % — ABNORMAL HIGH (ref 12.3–15.4)
RPR: NONREACTIVE
RUBELLA: 1.25 {index} (ref >0.99)
Rh Factor: POSITIVE
WBC: 11.8 10*3/uL — ABNORMAL HIGH (ref 3.4–10.8)

## 2017-05-25 LAB — SICKLE CELL SCREEN: SICKLE CELL SCREEN: NEGATIVE

## 2017-05-25 LAB — HEMOGLOBIN A1C
Est. average glucose Bld gHb Est-mCnc: 105 mg/dL
Hgb A1c MFr Bld: 5.3 % (ref 4.8–5.6)

## 2017-05-26 LAB — AFP TETRA
DIA MOM VALUE: 0.81
DIA VALUE (EIA): 133.45 pg/mL
DSR (By Age)    1 IN: 1144
DSR (Second Trimester) 1 IN: 9996
GESTATIONAL AGE AFP: 21 wk
MSAFP Mom: 1.05
MSAFP: 53.3 ng/mL
MSHCG MOM: 1.86
MSHCG: 30203 m[IU]/mL
Maternal Age At EDD: 21.2 yr
Osb Risk: 10000
TEST RESULTS AFP: NEGATIVE
UE3 VALUE: 1.6 ng/mL
Weight: 263 [lb_av]
uE3 Mom: 0.89

## 2017-05-28 LAB — GC/CHLAMYDIA PROBE AMP (~~LOC~~) NOT AT ARMC
CHLAMYDIA, DNA PROBE: NEGATIVE
NEISSERIA GONORRHEA: NEGATIVE

## 2017-05-29 LAB — CULTURE, URINE COMPREHENSIVE

## 2017-06-01 ENCOUNTER — Other Ambulatory Visit: Payer: Self-pay | Admitting: Family Medicine

## 2017-06-01 ENCOUNTER — Ambulatory Visit (HOSPITAL_COMMUNITY)
Admission: RE | Admit: 2017-06-01 | Discharge: 2017-06-01 | Disposition: A | Discharge status: Home / Self Care | Payer: 59 | Source: Ambulatory Visit | Attending: Family Medicine | Admitting: Family Medicine

## 2017-06-01 DIAGNOSIS — Z363 Encounter for antenatal screening for malformations: Secondary | ICD-10-CM | POA: Diagnosis not present

## 2017-06-01 DIAGNOSIS — Z3A22 22 weeks gestation of pregnancy: Secondary | ICD-10-CM | POA: Insufficient documentation

## 2017-06-01 DIAGNOSIS — Z3482 Encounter for supervision of other normal pregnancy, second trimester: Secondary | ICD-10-CM

## 2017-06-01 DIAGNOSIS — E669 Obesity, unspecified: Secondary | ICD-10-CM | POA: Insufficient documentation

## 2017-06-01 DIAGNOSIS — O99212 Obesity complicating pregnancy, second trimester: Secondary | ICD-10-CM | POA: Insufficient documentation

## 2017-06-25 ENCOUNTER — Ambulatory Visit (INDEPENDENT_AMBULATORY_CARE_PROVIDER_SITE_OTHER): Payer: 59 | Admitting: Obstetrics & Gynecology

## 2017-06-25 ENCOUNTER — Encounter: Payer: 59 | Admitting: Obstetrics & Gynecology

## 2017-06-25 VITALS — BP 100/79 | HR 84 | Wt 264.0 lb

## 2017-06-25 DIAGNOSIS — Z3493 Encounter for supervision of normal pregnancy, unspecified, third trimester: Secondary | ICD-10-CM

## 2017-06-25 DIAGNOSIS — Z3482 Encounter for supervision of other normal pregnancy, second trimester: Secondary | ICD-10-CM

## 2017-06-25 DIAGNOSIS — Z3480 Encounter for supervision of other normal pregnancy, unspecified trimester: Secondary | ICD-10-CM

## 2017-06-25 DIAGNOSIS — E669 Obesity, unspecified: Secondary | ICD-10-CM

## 2017-06-25 DIAGNOSIS — O99212 Obesity complicating pregnancy, second trimester: Secondary | ICD-10-CM

## 2017-06-25 NOTE — Progress Notes (Signed)
   PRENATAL VISIT NOTE  Subjective:  Maria Simpson is a 21 y.o. G2P1001 at [redacted]w[redacted]d being seen today for ongoing prenatal care.  She is currently monitored for the following issues for this high-risk pregnancy and has Supervision of other normal pregnancy, antepartum and Obesity affecting pregnancy in second trimester on her problem list.  Patient reports no complaints.  Contractions: Not present. Vag. Bleeding: None.  Movement: Present. Denies leaking of fluid.   The following portions of the patient's history were reviewed and updated as appropriate: allergies, current medications, past family history, past medical history, past social history, past surgical history and problem list. Problem list updated.  Objective:   Vitals:   06/25/17 1015  BP: 100/79  Pulse: 84  Weight: 264 lb (119.7 kg)    Fetal Status: Fetal Heart Rate (bpm): 146   Movement: Present     General:  Alert, oriented and cooperative. Patient is in no acute distress.  Skin: Skin is warm and dry. No rash noted.   Cardiovascular: Normal heart rate noted  Respiratory: Normal respiratory effort, no problems with respiration noted  Abdomen: Soft, gravid, appropriate for gestational age.  Pain/Pressure: Present     Pelvic: Cervical exam deferred        Extremities: Normal range of motion.  Edema: None  Mental Status:  Normal mood and affect. Normal behavior. Normal judgment and thought content.   Assessment and Plan:  Pregnancy: G2P1001 at [redacted]w[redacted]d  1. Encounter for supervision of normal pregnancy in third trimester, unspecified gravidity - [place] MFM OB FOLLOW UP; Future  2. Supervision of other normal pregnancy, antepartum   3. Obesity affecting pregnancy in second trimester [place] for S>D  Preterm labor symptoms and general obstetric precautions including but not limited to vaginal bleeding, contractions, leaking of fluid and fetal movement were reviewed in detail with the patient. Please refer to After Visit Summary for  other counseling recommendations.  Return in about 4 weeks (around 07/23/2017).   Willodean Rosenthal, MD

## 2017-06-25 NOTE — Patient Instructions (Signed)

## 2017-06-25 NOTE — Progress Notes (Signed)
Pt declined flu shot °

## 2017-07-03 ENCOUNTER — Other Ambulatory Visit: Payer: Self-pay | Admitting: Obstetrics & Gynecology

## 2017-07-03 ENCOUNTER — Ambulatory Visit (HOSPITAL_COMMUNITY)
Admission: RE | Admit: 2017-07-03 | Discharge: 2017-07-03 | Disposition: A | Discharge status: Home / Self Care | Payer: 59 | Source: Ambulatory Visit | Attending: Obstetrics & Gynecology | Admitting: Obstetrics & Gynecology

## 2017-07-03 DIAGNOSIS — O99210 Obesity complicating pregnancy, unspecified trimester: Secondary | ICD-10-CM

## 2017-07-03 DIAGNOSIS — Z3A26 26 weeks gestation of pregnancy: Secondary | ICD-10-CM

## 2017-07-03 DIAGNOSIS — Z3492 Encounter for supervision of normal pregnancy, unspecified, second trimester: Secondary | ICD-10-CM | POA: Diagnosis not present

## 2017-07-03 DIAGNOSIS — Z3493 Encounter for supervision of normal pregnancy, unspecified, third trimester: Secondary | ICD-10-CM

## 2017-07-03 DIAGNOSIS — Z362 Encounter for other antenatal screening follow-up: Secondary | ICD-10-CM | POA: Insufficient documentation

## 2017-07-24 ENCOUNTER — Encounter: Payer: Self-pay | Admitting: Advanced Practice Midwife

## 2017-07-24 ENCOUNTER — Ambulatory Visit (INDEPENDENT_AMBULATORY_CARE_PROVIDER_SITE_OTHER): Payer: 59 | Admitting: Advanced Practice Midwife

## 2017-07-24 VITALS — BP 107/64 | HR 100 | Wt 269.0 lb

## 2017-07-24 DIAGNOSIS — Z3483 Encounter for supervision of other normal pregnancy, third trimester: Secondary | ICD-10-CM

## 2017-07-24 DIAGNOSIS — Z23 Encounter for immunization: Secondary | ICD-10-CM | POA: Diagnosis not present

## 2017-07-24 DIAGNOSIS — Z3490 Encounter for supervision of normal pregnancy, unspecified, unspecified trimester: Secondary | ICD-10-CM

## 2017-07-24 NOTE — Patient Instructions (Signed)
Third Trimester of Pregnancy The third trimester is from week 28 through week 40 (months 7 through 9). The third trimester is a time when the unborn baby (fetus) is growing rapidly. At the end of the ninth month, the fetus is about 20 inches in length and weighs 6-10 pounds. Body changes during your third trimester Your body will continue to go through many changes during pregnancy. The changes vary from woman to woman. During the third trimester:  Your weight will continue to increase. You can expect to gain 25-35 pounds (11-16 kg) by the end of the pregnancy.  You may begin to get stretch marks on your hips, abdomen, and breasts.  You may urinate more often because the fetus is moving lower into your pelvis and pressing on your bladder.  You may develop or continue to have heartburn. This is caused by increased hormones that slow down muscles in the digestive tract.  You may develop or continue to have constipation because increased hormones slow digestion and cause the muscles that push waste through your intestines to relax.  You may develop hemorrhoids. These are swollen veins (varicose veins) in the rectum that can itch or be painful.  You may develop swollen, bulging veins (varicose veins) in your legs.  You may have increased body aches in the pelvis, back, or thighs. This is due to weight gain and increased hormones that are relaxing your joints.  You may have changes in your hair. These can include thickening of your hair, rapid growth, and changes in texture. Some women also have hair loss during or after pregnancy, or hair that feels dry or thin. Your hair will most likely return to normal after your baby is born.  Your breasts will continue to grow and they will continue to become tender. A yellow fluid (colostrum) may leak from your breasts. This is the first milk you are producing for your baby.  Your belly button may stick out.  You may notice more swelling in your hands,  face, or ankles.  You may have increased tingling or numbness in your hands, arms, and legs. The skin on your belly may also feel numb.  You may feel short of breath because of your expanding uterus.  You may have more problems sleeping. This can be caused by the size of your belly, increased need to urinate, and an increase in your body's metabolism.  You may notice the fetus "dropping," or moving lower in your abdomen (lightening).  You may have increased vaginal discharge.  You may notice your joints feel loose and you may have pain around your pelvic bone.  What to expect at prenatal visits You will have prenatal exams every 2 weeks until week 36. Then you will have weekly prenatal exams. During a routine prenatal visit:  You will be weighed to make sure you and the baby are growing normally.  Your blood pressure will be taken.  Your abdomen will be measured to track your baby's growth.  The fetal heartbeat will be listened to.  Any test results from the previous visit will be discussed.  You may have a cervical check near your due date to see if your cervix has softened or thinned (effaced).  You will be tested for Group B streptococcus. This happens between 35 and 37 weeks.  Your health care provider may ask you:  What your birth plan is.  How you are feeling.  If you are feeling the baby move.  If you have had   any abnormal symptoms, such as leaking fluid, bleeding, severe headaches, or abdominal cramping.  If you are using any tobacco products, including cigarettes, chewing tobacco, and electronic cigarettes.  If you have any questions.  Other tests or screenings that may be performed during your third trimester include:  Blood tests that check for low iron levels (anemia).  Fetal testing to check the health, activity level, and growth of the fetus. Testing is done if you have certain medical conditions or if there are problems during the  pregnancy.  Nonstress test (NST). This test checks the health of your baby to make sure there are no signs of problems, such as the baby not getting enough oxygen. During this test, a belt is placed around your belly. The baby is made to move, and its heart rate is monitored during movement.  What is false labor? False labor is a condition in which you feel small, irregular tightenings of the muscles in the womb (contractions) that usually go away with rest, changing position, or drinking water. These are called Braxton Hicks contractions. Contractions may last for hours, days, or even weeks before true labor sets in. If contractions come at regular intervals, become more frequent, increase in intensity, or become painful, you should see your health care provider. What are the signs of labor?  Abdominal cramps.  Regular contractions that start at 10 minutes apart and become stronger and more frequent with time.  Contractions that start on the top of the uterus and spread down to the lower abdomen and back.  Increased pelvic pressure and dull back pain.  A watery or bloody mucus discharge that comes from the vagina.  Leaking of amniotic fluid. This is also known as your "water breaking." It could be a slow trickle or a gush. Let your health care provider know if it has a color or strange odor. If you have any of these signs, call your health care provider right away, even if it is before your due date. Follow these instructions at home: Medicines  Follow your health care provider's instructions regarding medicine use. Specific medicines may be either safe or unsafe to take during pregnancy.  Take a prenatal vitamin that contains at least 600 micrograms (mcg) of folic acid.  If you develop constipation, try taking a stool softener if your health care provider approves. Eating and drinking  Eat a balanced diet that includes fresh fruits and vegetables, whole grains, good sources of protein  such as meat, eggs, or tofu, and low-fat dairy. Your health care provider will help you determine the amount of weight gain that is right for you.  Avoid raw meat and uncooked cheese. These carry germs that can cause birth defects in the baby.  If you have low calcium intake from food, talk to your health care provider about whether you should take a daily calcium supplement.  Eat four or five small meals rather than three large meals a day.  Limit foods that are high in fat and processed sugars, such as fried and sweet foods.  To prevent constipation: ? Drink enough fluid to keep your urine clear or pale yellow. ? Eat foods that are high in fiber, such as fresh fruits and vegetables, whole grains, and beans. Activity  Exercise only as directed by your health care provider. Most women can continue their usual exercise routine during pregnancy. Try to exercise for 30 minutes at least 5 days a week. Stop exercising if you experience uterine contractions.  Avoid heavy   lifting.  Do not exercise in extreme heat or humidity, or at high altitudes.  Wear low-heel, comfortable shoes.  Practice good posture.  You may continue to have sex unless your health care provider tells you otherwise. Relieving pain and discomfort  Take frequent breaks and rest with your legs elevated if you have leg cramps or low back pain.  Take warm sitz baths to soothe any pain or discomfort caused by hemorrhoids. Use hemorrhoid cream if your health care provider approves.  Wear a good support bra to prevent discomfort from breast tenderness.  If you develop varicose veins: ? Wear support pantyhose or compression stockings as told by your healthcare provider. ? Elevate your feet for 15 minutes, 3-4 times a day. Prenatal care  Write down your questions. Take them to your prenatal visits.  Keep all your prenatal visits as told by your health care provider. This is important. Safety  Wear your seat belt at  all times when driving.  Make a list of emergency phone numbers, including numbers for family, friends, the hospital, and police and fire departments. General instructions  Avoid cat litter boxes and soil used by cats. These carry germs that can cause birth defects in the baby. If you have a cat, ask someone to clean the litter box for you.  Do not travel far distances unless it is absolutely necessary and only with the approval of your health care provider.  Do not use hot tubs, steam rooms, or saunas.  Do not drink alcohol.  Do not use any products that contain nicotine or tobacco, such as cigarettes and e-cigarettes. If you need help quitting, ask your health care provider.  Do not use any medicinal herbs or unprescribed drugs. These chemicals affect the formation and growth of the baby.  Do not douche or use tampons or scented sanitary pads.  Do not cross your legs for long periods of time.  To prepare for the arrival of your baby: ? Take prenatal classes to understand, practice, and ask questions about labor and delivery. ? Make a trial run to the hospital. ? Visit the hospital and tour the maternity area. ? Arrange for maternity or paternity leave through employers. ? Arrange for family and friends to take care of pets while you are in the hospital. ? Purchase a rear-facing car seat and make sure you know how to install it in your car. ? Pack your hospital bag. ? Prepare the baby's nursery. Make sure to remove all pillows and stuffed animals from the baby's crib to prevent suffocation.  Visit your dentist if you have not gone during your pregnancy. Use a soft toothbrush to brush your teeth and be gentle when you floss. Contact a health care provider if:  You are unsure if you are in labor or if your water has broken.  You become dizzy.  You have mild pelvic cramps, pelvic pressure, or nagging pain in your abdominal area.  You have lower back pain.  You have persistent  nausea, vomiting, or diarrhea.  You have an unusual or bad smelling vaginal discharge.  You have pain when you urinate. Get help right away if:  Your water breaks before 37 weeks.  You have regular contractions less than 5 minutes apart before 37 weeks.  You have a fever.  You are leaking fluid from your vagina.  You have spotting or bleeding from your vagina.  You have severe abdominal pain or cramping.  You have rapid weight loss or weight gain.    You have shortness of breath with chest pain.  You notice sudden or extreme swelling of your face, hands, ankles, feet, or legs.  Your baby makes fewer than 10 movements in 2 hours.  You have severe headaches that do not go away when you take medicine.  You have vision changes. Summary  The third trimester is from week 28 through week 40, months 7 through 9. The third trimester is a time when the unborn baby (fetus) is growing rapidly.  During the third trimester, your discomfort may increase as you and your baby continue to gain weight. You may have abdominal, leg, and back pain, sleeping problems, and an increased need to urinate.  During the third trimester your breasts will keep growing and they will continue to become tender. A yellow fluid (colostrum) may leak from your breasts. This is the first milk you are producing for your baby.  False labor is a condition in which you feel small, irregular tightenings of the muscles in the womb (contractions) that eventually go away. These are called Braxton Hicks contractions. Contractions may last for hours, days, or even weeks before true labor sets in.  Signs of labor can include: abdominal cramps; regular contractions that start at 10 minutes apart and become stronger and more frequent with time; watery or bloody mucus discharge that comes from the vagina; increased pelvic pressure and dull back pain; and leaking of amniotic fluid. This information is not intended to replace advice  given to you by your health care provider. Make sure you discuss any questions you have with your health care provider. Document Released: 09/12/2001 Document Revised: 02/24/2016 Document Reviewed: 11/19/2012 Elsevier Interactive Patient Education  2017 [employer]  

## 2017-07-24 NOTE — Progress Notes (Signed)
   PRENATAL VISIT NOTE  Subjective:  Maria Simpson is a 21 y.o. G2P1001 at 6732w5d being seen today for ongoing prenatal care.  She is currently monitored for the following issues for this low-risk pregnancy and has Supervision of other normal pregnancy, antepartum and Obesity affecting pregnancy in second trimester on her problem list.  Patient reports no complaints.  Contractions: Not present. Vag. Bleeding: None.  Movement: Present. Denies leaking of fluid.   The following portions of the patient's history were reviewed and updated as appropriate: allergies, current medications, past family history, past medical history, past social history, past surgical history and problem list. Problem list updated.  Objective:   Vitals:   07/24/17 0936  BP: 107/64  Pulse: 100  Weight: 269 lb (122 kg)    Fetal Status: Fetal Heart Rate (bpm): 138   Movement: Present     General:  Alert, oriented and cooperative. Patient is in no acute distress.  Skin: Skin is warm and dry. No rash noted.   Cardiovascular: Normal heart rate noted  Respiratory: Normal respiratory effort, no problems with respiration noted  Abdomen: Soft, gravid, appropriate for gestational age.  Pain/Pressure: Absent     Pelvic: Cervical exam deferred        Extremities: Normal range of motion.  Edema: None  Mental Status:  Normal mood and affect. Normal behavior. Normal judgment and thought content.   Assessment and Plan:  Pregnancy: G2P1001 at 4332w5d  1. Prenatal care, antepartum  - Glucose Tolerance, 2 Hours w/1 Hour - CBC - RPR - HIV antibody (with reflex)   Wants to start leave at work. Discussed we cannot certify disability. Will check with job re: requirements  Preterm labor symptoms and general obstetric precautions including but not limited to vaginal bleeding, contractions, leaking of fluid and fetal movement were reviewed in detail with the patient. Please refer to After Visit Summary for other counseling  recommendations.  RTO 2 weeks  Wynelle BourgeoisMarie Williams, CNM

## 2017-07-25 LAB — CBC
HEMATOCRIT: 33.2 % — AB (ref 34.0–46.6)
Hemoglobin: 10.7 g/dL — ABNORMAL LOW (ref 11.1–15.9)
MCH: 25.7 pg — ABNORMAL LOW (ref 26.6–33.0)
MCHC: 32.2 g/dL (ref 31.5–35.7)
MCV: 80 fL (ref 79–97)
PLATELETS: 390 10*3/uL — AB (ref 150–379)
RBC: 4.16 x10E6/uL (ref 3.77–5.28)
RDW: 15 % (ref 12.3–15.4)
WBC: 8.5 10*3/uL (ref 3.4–10.8)

## 2017-07-25 LAB — HIV ANTIBODY (ROUTINE TESTING W REFLEX): HIV SCREEN 4TH GENERATION: NONREACTIVE

## 2017-07-25 LAB — RPR: RPR Ser Ql: NONREACTIVE

## 2017-07-25 LAB — GLUCOSE TOLERANCE, 2 HOURS W/ 1HR
GLUCOSE, 1 HOUR: 107 mg/dL (ref 65–179)
GLUCOSE, 2 HOUR: 79 mg/dL (ref 65–152)
GLUCOSE, FASTING: 97 mg/dL — AB (ref 65–91)

## 2017-08-10 ENCOUNTER — Encounter: Payer: 59 | Admitting: Family Medicine

## 2017-08-14 ENCOUNTER — Encounter: Payer: 59 | Admitting: Advanced Practice Midwife

## 2017-08-17 ENCOUNTER — Ambulatory Visit (INDEPENDENT_AMBULATORY_CARE_PROVIDER_SITE_OTHER): Payer: 59 | Admitting: Family Medicine

## 2017-08-17 VITALS — BP 115/61 | HR 95 | Wt 265.0 lb

## 2017-08-17 DIAGNOSIS — Z3480 Encounter for supervision of other normal pregnancy, unspecified trimester: Secondary | ICD-10-CM

## 2017-08-17 DIAGNOSIS — Z3483 Encounter for supervision of other normal pregnancy, third trimester: Secondary | ICD-10-CM

## 2017-08-17 DIAGNOSIS — R739 Hyperglycemia, unspecified: Secondary | ICD-10-CM | POA: Diagnosis not present

## 2017-08-17 DIAGNOSIS — O99213 Obesity complicating pregnancy, third trimester: Secondary | ICD-10-CM

## 2017-08-17 DIAGNOSIS — O99212 Obesity complicating pregnancy, second trimester: Secondary | ICD-10-CM

## 2017-08-17 LAB — GLUCOSE, POCT (MANUAL RESULT ENTRY): POC GLUCOSE: 80 mg/dL (ref 70–99)

## 2017-08-17 NOTE — Progress Notes (Signed)
   PRENATAL VISIT NOTE  Subjective:  Maria Simpson is a 21 y.o. G2P1001 at 7084w1d being seen today for ongoing prenatal care.  She is currently monitored for the following issues for this low-risk pregnancy and has Supervision of other normal pregnancy, antepartum; Obesity affecting pregnancy in second trimester; and Late prenatal care on their problem list.  Patient reports no complaints.  Contractions: Not present. Vag. Bleeding: None.  Movement: Present. Denies leaking of fluid.   The following portions of the patient's history were reviewed and updated as appropriate: allergies, current medications, past family history, past medical history, past social history, past surgical history and problem list. Problem list updated.  Objective:   Vitals:   08/17/17 0944  BP: 115/61  Pulse: 95  Weight: 265 lb (120.2 kg)    Fetal Status: Fetal Heart Rate (bpm): 140   Movement: Present     General:  Alert, oriented and cooperative. Patient is in no acute distress.  Skin: Skin is warm and dry. No rash noted.   Cardiovascular: Normal heart rate noted  Respiratory: Normal respiratory effort, no problems with respiration noted  Abdomen: Soft, gravid, appropriate for gestational age.  Pain/Pressure: Absent     Pelvic: Cervical exam deferred        Extremities: Normal range of motion.     Mental Status:  Normal mood and affect. Normal behavior. Normal judgment and thought content.   Assessment and Plan:  Pregnancy: G2P1001 at 2384w1d  1. Supervision of other normal pregnancy, antepartum FHT and FH normal  2. Obesity affecting pregnancy in second trimester Appropriate weight gain  3. Elevated blood sugar Reviewed 2hr GTT with patient. Patient had eaten at 5:30am and test done at 10am. Fasting CBG today 80. No diabetes.  Preterm labor symptoms and general obstetric precautions including but not limited to vaginal bleeding, contractions, leaking of fluid and fetal movement were reviewed in  detail with the patient. Please refer to After Visit Summary for other counseling recommendations.  No Follow-up on file.   Levie HeritageJacob J Mykell Genao, DO

## 2017-08-30 ENCOUNTER — Encounter: Payer: Self-pay | Admitting: Family Medicine

## 2017-08-30 ENCOUNTER — Ambulatory Visit (INDEPENDENT_AMBULATORY_CARE_PROVIDER_SITE_OTHER): Payer: 59 | Admitting: Family Medicine

## 2017-08-30 ENCOUNTER — Other Ambulatory Visit (HOSPITAL_COMMUNITY)
Admission: RE | Admit: 2017-08-30 | Discharge: 2017-08-30 | Disposition: A | Discharge status: Home / Self Care | Payer: 59 | Source: Ambulatory Visit | Attending: Family Medicine | Admitting: Family Medicine

## 2017-08-30 VITALS — BP 116/66 | HR 85 | Wt 273.0 lb

## 2017-08-30 DIAGNOSIS — O99891 Other specified diseases and conditions complicating pregnancy: Secondary | ICD-10-CM

## 2017-08-30 DIAGNOSIS — M9903 Segmental and somatic dysfunction of lumbar region: Secondary | ICD-10-CM | POA: Diagnosis not present

## 2017-08-30 DIAGNOSIS — Z3490 Encounter for supervision of normal pregnancy, unspecified, unspecified trimester: Secondary | ICD-10-CM | POA: Insufficient documentation

## 2017-08-30 DIAGNOSIS — E669 Obesity, unspecified: Secondary | ICD-10-CM

## 2017-08-30 DIAGNOSIS — O99212 Obesity complicating pregnancy, second trimester: Secondary | ICD-10-CM

## 2017-08-30 DIAGNOSIS — M9902 Segmental and somatic dysfunction of thoracic region: Secondary | ICD-10-CM

## 2017-08-30 DIAGNOSIS — O9989 Other specified diseases and conditions complicating pregnancy, childbirth and the puerperium: Secondary | ICD-10-CM

## 2017-08-30 DIAGNOSIS — M549 Dorsalgia, unspecified: Secondary | ICD-10-CM

## 2017-08-30 DIAGNOSIS — Z3482 Encounter for supervision of other normal pregnancy, second trimester: Secondary | ICD-10-CM

## 2017-08-30 LAB — OB RESULTS CONSOLE GBS: GBS: NEGATIVE

## 2017-08-30 LAB — OB RESULTS CONSOLE GC/CHLAMYDIA: Gonorrhea: NEGATIVE

## 2017-08-30 NOTE — Progress Notes (Signed)
   PRENATAL VISIT NOTE  Subjective:  Maria Simpson is a 21 y.o. G2P1001 at 5647w0d being seen today for ongoing prenatal care.  She is currently monitored for the following issues for this low-risk pregnancy and has Supervision of other normal pregnancy, antepartum; Obesity affecting pregnancy in second trimester; and Late prenatal care on their problem list.  Patient reports backache.  Contractions: Not present. Vag. Bleeding: None.  Movement: Present. Denies leaking of fluid.   The following portions of the patient's history were reviewed and updated as appropriate: allergies, current medications, past family history, past medical history, past social history, past surgical history and problem list. Problem list updated.  Objective:   Vitals:   08/30/17 0939  BP: 116/66  Pulse: 85  Weight: 273 lb (123.8 kg)    Fetal Status: Fetal Heart Rate (bpm): 145 Fundal Height: 37 cm Movement: Present  Presentation: Vertex  General:  Alert, oriented and cooperative. Patient is in no acute distress.  Skin: Skin is warm and dry. No rash noted.   Cardiovascular: Normal heart rate noted  Respiratory: Normal respiratory effort, no problems with respiration noted  Abdomen: Soft, gravid, appropriate for gestational age. Pain/Pressure: Absent     Pelvic:  Cervical exam deferred Dilation: 1.5 Effacement (%): 50 Station: -3  MSK: Restriction, tenderness, tissue texture changes, and paraspinal spasm in the lumbar and thoracic spine  Neuro: Moves all four extremities with no focal neurological deficit  Extremities: Normal range of motion.  Edema: None  Mental Status: Normal mood and affect. Normal behavior. Normal judgment and thought content.   OSE: Head   Cervical   Thoracic  T10-12 FSRL  Rib T10 inhaled  Lumbar L5 ESRR  Sacrum L/L  Pelvis Right ant    Assessment and Plan:  Pregnancy: G2P1001 at 7547w0d  1. Prenatal care, antepartum - Culture, beta strep (group b only) - GC[facility]Chlamydia probe  [facility]  2. Obesity affecting pregnancy in second trimester  3. Back pain affecting pregnancy in third trimester 4. Somatic dysfunction of thoracic region 5. Somatic dysfunction of lumbar region OMT done after patient permission. HVLA technique utilized. Patient tolerated procedure well.   Preterm labor symptoms and general obstetric precautions including but not limited to vaginal bleeding, contractions, leaking of fluid and fetal movement were reviewed in detail with the patient. Please refer to After Visit Summary for other counseling recommendations.  No Follow-up on file.  Levie HeritageStinson, Harshitha Fretz J, DO

## 2017-08-31 LAB — GC/CHLAMYDIA PROBE AMP (~~LOC~~) NOT AT ARMC
Chlamydia: NEGATIVE
Neisseria Gonorrhea: NEGATIVE

## 2017-09-03 ENCOUNTER — Encounter: Payer: 59 | Admitting: Obstetrics & Gynecology

## 2017-09-04 LAB — CULTURE, BETA STREP (GROUP B ONLY): STREP GP B CULTURE: NEGATIVE

## 2017-09-07 ENCOUNTER — Ambulatory Visit (INDEPENDENT_AMBULATORY_CARE_PROVIDER_SITE_OTHER): Payer: 59 | Admitting: Family Medicine

## 2017-09-07 VITALS — BP 108/50 | HR 76 | Wt 279.0 lb

## 2017-09-07 DIAGNOSIS — Z3480 Encounter for supervision of other normal pregnancy, unspecified trimester: Secondary | ICD-10-CM

## 2017-09-07 DIAGNOSIS — E669 Obesity, unspecified: Secondary | ICD-10-CM

## 2017-09-07 DIAGNOSIS — O99212 Obesity complicating pregnancy, second trimester: Secondary | ICD-10-CM

## 2017-09-07 MED ORDER — OMEPRAZOLE 40 MG PO CPDR: 40.0000 mg | DELAYED_RELEASE_CAPSULE | Freq: Every day | ORAL | 1 refills | Status: DC

## 2017-09-07 NOTE — Progress Notes (Signed)
   PRENATAL VISIT NOTE  Subjective:  Maria Simpson is a 21 y.o. G2P1001 at 6149w1d being seen today for ongoing prenatal care.  She is currently monitored for the following issues for this low-risk pregnancy and has Supervision of other normal pregnancy, antepartum; Obesity affecting pregnancy in second trimester; and Late prenatal care on their problem list.  Patient reports heartburn.  Contractions: Not present. Vag. Bleeding: None.  Movement: Present. Denies leaking of fluid.   The following portions of the patient's history were reviewed and updated as appropriate: allergies, current medications, past family history, past medical history, past social history, past surgical history and problem list. Problem list updated.  Objective:   Vitals:   09/07/17 1111  BP: (!) 108/50  Pulse: 76  Weight: 279 lb (126.6 kg)    Fetal Status: Fetal Heart Rate (bpm): 133   Movement: Present     General:  Alert, oriented and cooperative. Patient is in no acute distress.  Skin: Skin is warm and dry. No rash noted.   Cardiovascular: Normal heart rate noted  Respiratory: Normal respiratory effort, no problems with respiration noted  Abdomen: Soft, gravid, appropriate for gestational age.  Pain/Pressure: Absent     Pelvic: Cervical exam deferred        Extremities: Normal range of motion.  Edema: None  Mental Status:  Normal mood and affect. Normal behavior. Normal judgment and thought content.   Assessment and Plan:  Pregnancy: G2P1001 at 6049w1d  1. Supervision of other normal pregnancy, antepartum FHT and FH noraml. Prilosec for heartburn  2. Obesity affecting pregnancy in second trimester   Preterm labor symptoms and general obstetric precautions including but not limited to vaginal bleeding, contractions, leaking of fluid and fetal movement were reviewed in detail with the patient. Please refer to After Visit Summary for other counseling recommendations.  No Follow-up on file.   Levie HeritageJacob J  Leigh Kaeding, DO

## 2017-09-13 ENCOUNTER — Encounter: Payer: 59 | Admitting: Obstetrics & Gynecology

## 2017-09-18 ENCOUNTER — Encounter: Payer: 59 | Admitting: Advanced Practice Midwife

## 2017-09-22 ENCOUNTER — Encounter (HOSPITAL_COMMUNITY): Payer: Self-pay

## 2017-09-22 ENCOUNTER — Inpatient Hospital Stay (HOSPITAL_COMMUNITY)
Admission: AD | Admit: 2017-09-22 | Discharge: 2017-09-22 | Disposition: A | Discharge status: Home / Self Care | Payer: 59 | Source: Ambulatory Visit | Attending: Obstetrics and Gynecology | Admitting: Obstetrics and Gynecology

## 2017-09-22 ENCOUNTER — Other Ambulatory Visit: Payer: Self-pay

## 2017-09-22 DIAGNOSIS — Z3A38 38 weeks gestation of pregnancy: Secondary | ICD-10-CM | POA: Diagnosis not present

## 2017-09-22 DIAGNOSIS — O479 False labor, unspecified: Secondary | ICD-10-CM

## 2017-09-22 NOTE — MAU Note (Signed)
ctxs since yesterday. Starts in back and comes around to front. Denies LOF or bleeding. Missed last appt but was 1.5cm 2 wks ago

## 2017-09-22 NOTE — Discharge Instructions (Signed)
Braxton Hicks Contractions °Contractions of the uterus can occur throughout pregnancy, but they are not always a sign that you are in labor. You may have practice contractions called Braxton Hicks contractions. These false labor contractions are sometimes confused with true labor. °What are Braxton Hicks contractions? °Braxton Hicks contractions are tightening movements that occur in the muscles of the uterus before labor. Unlike true labor contractions, these contractions do not result in opening (dilation) and thinning of the cervix. Toward the end of pregnancy (32-34 weeks), Braxton Hicks contractions can happen more often and may become stronger. These contractions are sometimes difficult to tell apart from true labor because they can be very uncomfortable. You should not feel embarrassed if you go to the hospital with false labor. °Sometimes, the only way to tell if you are in true labor is for your health care provider to look for changes in the cervix. The health care provider will do a physical exam and may monitor your contractions. If you are not in true labor, the exam should show that your cervix is not dilating and your water has not broken. °If there are other health problems associated with your pregnancy, it is completely safe for you to be sent home with false labor. You may continue to have Braxton Hicks contractions until you go into true labor. °How to tell the difference between true labor and false labor °True labor °· Contractions last 30-70 seconds. °· Contractions become very regular. °· Discomfort is usually felt in the top of the uterus, and it spreads to the lower abdomen and low back. °· Contractions do not go away with walking. °· Contractions usually become more intense and increase in frequency. °· The cervix dilates and gets thinner. °False labor °· Contractions are usually shorter and not as strong as true labor contractions. °· Contractions are usually irregular. °· Contractions  are often felt in the front of the lower abdomen and in the groin. °· Contractions may go away when you walk around or change positions while lying down. °· Contractions get weaker and are shorter-lasting as time goes on. °· The cervix usually does not dilate or become thin. °Follow these instructions at home: °· Take over-the-counter and prescription medicines only as told by your health care provider. °· Keep up with your usual exercises and follow other instructions from your health care provider. °· Eat and drink lightly if you think you are going into labor. °· If Braxton Hicks contractions are making you uncomfortable: °? Change your position from lying down or resting to walking, or change from walking to resting. °? Sit and rest in a tub of warm water. °? Drink enough fluid to keep your urine pale yellow. Dehydration may cause these contractions. °? Do slow and deep breathing several times an hour. °· Keep all follow-up prenatal visits as told by your health care provider. This is important. °Contact a health care provider if: °· You have a fever. °· You have continuous pain in your abdomen. °Get help right away if: °· Your contractions become stronger, more regular, and closer together. °· You have fluid leaking or gushing from your vagina. °· You pass blood-tinged mucus (bloody show). °· You have bleeding from your vagina. °· You have low back pain that you never had before. °· You feel your baby’s head pushing down and causing pelvic pressure. °· Your baby is not moving inside you as much as it used to. °Summary °· Contractions that occur before labor are called Braxton   Hicks contractions, false labor, or practice contractions. °· Braxton Hicks contractions are usually shorter, weaker, farther apart, and less regular than true labor contractions. True labor contractions usually become progressively stronger and regular and they become more frequent. °· Manage discomfort from Braxton Hicks contractions by  changing position, resting in a warm bath, drinking plenty of water, or practicing deep breathing. °This information is not intended to replace advice given to you by your health care provider. Make sure you discuss any questions you have with your health care provider. °Document Released: 02/01/2017 Document Revised: 02/01/2017 Document Reviewed: 02/01/2017 °Elsevier Interactive Patient Education © 2018 [employer] ° °

## 2017-09-22 NOTE — MAU Note (Signed)
I have communicated with Carloyn Jaeger. Dawson, CNM and reviewed vital signs:  Vitals:   09/22/17 2004 09/22/17 2125  BP: 127/72 120/83  Pulse:  86  Resp:  18  Temp:  97.7 F (36.5 C)    Vaginal exam:  Dilation: 1.5 Effacement (%): 50 Station: -2,   Also reviewed contraction pattern and that non-stress test is reactive.  It has been documented that patient is  contracting intermittently, with some irritability and no   cervical change since last seen in office,  not indicating active labor.  Patient denies any other complaints.  Based on this report provider has given order for discharge.  A discharge order and diagnosis entered by a provider.   Labor discharge instructions reviewed with patient.

## 2017-09-28 ENCOUNTER — Ambulatory Visit (INDEPENDENT_AMBULATORY_CARE_PROVIDER_SITE_OTHER): Payer: 59 | Admitting: Obstetrics & Gynecology

## 2017-09-28 DIAGNOSIS — Z3480 Encounter for supervision of other normal pregnancy, unspecified trimester: Secondary | ICD-10-CM

## 2017-09-28 DIAGNOSIS — Z3483 Encounter for supervision of other normal pregnancy, third trimester: Secondary | ICD-10-CM

## 2017-09-28 NOTE — Progress Notes (Signed)
   PRENATAL VISIT NOTE  Subjective:  Maria Simpson is a 21 y.o. G2P1001 at 4691w1d being seen today for ongoing prenatal care.  She is currently monitored for the following issues for this low-risk pregnancy and has Supervision of other normal pregnancy, antepartum; Obesity affecting pregnancy in second trimester; and Late prenatal care on their problem list.  Patient reports no complaints.  Contractions: Not present. Vag. Bleeding: None.  Movement: Present. Denies leaking of fluid.   The following portions of the patient's history were reviewed and updated as appropriate: allergies, current medications, past family history, past medical history, past social history, past surgical history and problem list. Problem list updated.  Objective:   Vitals:   09/28/17 0858  BP: 116/73  Pulse: 90  Weight: 285 lb (129.3 kg)    Fetal Status: Fetal Heart Rate (bpm): 145   Movement: Present     General:  Alert, oriented and cooperative. Patient is in no acute distress.  Skin: Skin is warm and dry. No rash noted.   Cardiovascular: Normal heart rate noted  Respiratory: Normal respiratory effort, no problems with respiration noted  Abdomen: Soft, gravid, appropriate for gestational age.  Pain/Pressure: Present     Pelvic: Cervical exam performed        Extremities: Normal range of motion.     Mental Status:  Normal mood and affect. Normal behavior. Normal judgment and thought content.   Assessment and Plan:  Pregnancy: G2P1001 at 1891w1d  1. Supervision of other normal pregnancy, antepartum   Term labor symptoms and general obstetric precautions including but not limited to vaginal bleeding, contractions, leaking of fluid and fetal movement were reviewed in detail with the patient. Please refer to After Visit Summary for other counseling recommendations.  No Follow-up on file.   Allie BossierMyra C Jaxon Mynhier, MD

## 2017-09-30 ENCOUNTER — Inpatient Hospital Stay (HOSPITAL_COMMUNITY)
Admission: AD | Admit: 2017-09-30 | Discharge: 2017-10-02 | DRG: 807 | Disposition: A | Discharge status: Home / Self Care | Payer: 59 | Source: Ambulatory Visit | Attending: Obstetrics & Gynecology | Admitting: Obstetrics & Gynecology

## 2017-09-30 ENCOUNTER — Encounter (HOSPITAL_COMMUNITY): Payer: Self-pay | Admitting: *Deleted

## 2017-09-30 DIAGNOSIS — O0930 Supervision of pregnancy with insufficient antenatal care, unspecified trimester: Secondary | ICD-10-CM

## 2017-09-30 DIAGNOSIS — O99214 Obesity complicating childbirth: Secondary | ICD-10-CM | POA: Diagnosis present

## 2017-09-30 DIAGNOSIS — O4292 Full-term premature rupture of membranes, unspecified as to length of time between rupture and onset of labor: Principal | ICD-10-CM | POA: Diagnosis present

## 2017-09-30 DIAGNOSIS — Z3480 Encounter for supervision of other normal pregnancy, unspecified trimester: Secondary | ICD-10-CM

## 2017-09-30 DIAGNOSIS — O4290 Premature rupture of membranes, unspecified as to length of time between rupture and onset of labor, unspecified weeks of gestation: Secondary | ICD-10-CM | POA: Diagnosis present

## 2017-09-30 DIAGNOSIS — D649 Anemia, unspecified: Secondary | ICD-10-CM | POA: Diagnosis present

## 2017-09-30 DIAGNOSIS — O9902 Anemia complicating childbirth: Secondary | ICD-10-CM | POA: Diagnosis present

## 2017-09-30 DIAGNOSIS — O99212 Obesity complicating pregnancy, second trimester: Secondary | ICD-10-CM | POA: Diagnosis present

## 2017-09-30 DIAGNOSIS — Z3A39 39 weeks gestation of pregnancy: Secondary | ICD-10-CM

## 2017-09-30 LAB — POCT FERN TEST: POCT Fern Test: POSITIVE

## 2017-09-30 NOTE — MAU Note (Signed)
Pt reports LOF since 2200 and some contractions + FM

## 2017-10-01 ENCOUNTER — Encounter (HOSPITAL_COMMUNITY): Payer: Self-pay | Admitting: *Deleted

## 2017-10-01 ENCOUNTER — Inpatient Hospital Stay (HOSPITAL_COMMUNITY): Payer: 59 | Admitting: Anesthesiology

## 2017-10-01 ENCOUNTER — Other Ambulatory Visit: Payer: Self-pay

## 2017-10-01 DIAGNOSIS — Z3A39 39 weeks gestation of pregnancy: Secondary | ICD-10-CM

## 2017-10-01 DIAGNOSIS — O4292 Full-term premature rupture of membranes, unspecified as to length of time between rupture and onset of labor: Secondary | ICD-10-CM | POA: Diagnosis present

## 2017-10-01 DIAGNOSIS — O9902 Anemia complicating childbirth: Secondary | ICD-10-CM | POA: Diagnosis present

## 2017-10-01 DIAGNOSIS — O4290 Premature rupture of membranes, unspecified as to length of time between rupture and onset of labor, unspecified weeks of gestation: Secondary | ICD-10-CM | POA: Diagnosis present

## 2017-10-01 DIAGNOSIS — O99214 Obesity complicating childbirth: Secondary | ICD-10-CM | POA: Diagnosis present

## 2017-10-01 DIAGNOSIS — D649 Anemia, unspecified: Secondary | ICD-10-CM | POA: Diagnosis present

## 2017-10-01 LAB — CBC
HEMATOCRIT: 30.5 % — AB (ref 36.0–46.0)
Hemoglobin: 9.7 g/dL — ABNORMAL LOW (ref 12.0–15.0)
MCH: 23.6 pg — ABNORMAL LOW (ref 26.0–34.0)
MCHC: 31.8 g/dL (ref 30.0–36.0)
MCV: 74.2 fL — ABNORMAL LOW (ref 78.0–100.0)
PLATELETS: 379 10*3/uL (ref 150–400)
RBC: 4.11 MIL/uL (ref 3.87–5.11)
RDW: 15.7 % — AB (ref 11.5–15.5)
WBC: 12.2 10*3/uL — ABNORMAL HIGH (ref 4.0–10.5)

## 2017-10-01 LAB — RPR: RPR Ser Ql: NONREACTIVE

## 2017-10-01 LAB — TYPE AND SCREEN
ABO/RH(D): O POS
Antibody Screen: NEGATIVE

## 2017-10-01 MED ORDER — EPHEDRINE 5 MG/ML INJ
10.0000 mg | INTRAVENOUS | Status: DC | PRN
  Filled 2017-10-01: qty 2

## 2017-10-01 MED ORDER — OXYTOCIN 40 UNITS IN LACTATED RINGERS INFUSION - SIMPLE MED
1.0000 m[IU]/min | INTRAVENOUS | Status: DC
  Administered 2017-10-01: 2 m[IU]/min via INTRAVENOUS

## 2017-10-01 MED ORDER — IBUPROFEN 600 MG PO TABS
600.0000 mg | ORAL_TABLET | Freq: Four times a day (QID) | ORAL | Status: DC
  Administered 2017-10-01 – 2017-10-02 (×5): 600 mg via ORAL
  Filled 2017-10-01 (×5): qty 1

## 2017-10-01 MED ORDER — PHENYLEPHRINE 40 MCG/ML (10ML) SYRINGE FOR IV PUSH (FOR BLOOD PRESSURE SUPPORT)
80.0000 ug | PREFILLED_SYRINGE | INTRAVENOUS | Status: DC | PRN
  Filled 2017-10-01: qty 5

## 2017-10-01 MED ORDER — FENTANYL CITRATE (PF) 100 MCG/2ML IJ SOLN
100.0000 ug | INTRAMUSCULAR | Status: DC | PRN
  Administered 2017-10-01: 100 ug via INTRAVENOUS
  Filled 2017-10-01: qty 2

## 2017-10-01 MED ORDER — BENZOCAINE-MENTHOL 20-0.5 % EX AERO
1.0000 "application " | INHALATION_SPRAY | CUTANEOUS | Status: DC | PRN
  Filled 2017-10-01: qty 56

## 2017-10-01 MED ORDER — LACTATED RINGERS IV SOLN
INTRAVENOUS | Status: DC
  Administered 2017-10-01 (×2): via INTRAVENOUS

## 2017-10-01 MED ORDER — ACETAMINOPHEN 325 MG PO TABS
650.0000 mg | ORAL_TABLET | ORAL | Status: DC | PRN
  Administered 2017-10-01 (×2): 650 mg via ORAL
  Filled 2017-10-01 (×2): qty 2

## 2017-10-01 MED ORDER — TETANUS-DIPHTH-ACELL PERTUSSIS 5-2.5-18.5 LF-MCG/0.5 IM SUSP: 0.5000 mL | Freq: Once | INTRAMUSCULAR | Status: DC

## 2017-10-01 MED ORDER — LIDOCAINE HCL (PF) 1 % IJ SOLN
INTRAMUSCULAR | Status: DC | PRN
  Administered 2017-10-01 (×2): 4 mL via EPIDURAL

## 2017-10-01 MED ORDER — ZOLPIDEM TARTRATE 5 MG PO TABS: 5.0000 mg | ORAL_TABLET | Freq: Every evening | ORAL | Status: DC | PRN

## 2017-10-01 MED ORDER — PRENATAL MULTIVITAMIN CH
1.0000 | ORAL_TABLET | Freq: Every day | ORAL | Status: DC
  Administered 2017-10-01 – 2017-10-02 (×2): 1 via ORAL
  Filled 2017-10-01 (×2): qty 1

## 2017-10-01 MED ORDER — OXYTOCIN 40 UNITS IN LACTATED RINGERS INFUSION - SIMPLE MED
2.5000 [IU]/h | INTRAVENOUS | Status: DC
  Filled 2017-10-01: qty 1000

## 2017-10-01 MED ORDER — WITCH HAZEL-GLYCERIN EX PADS: 1.0000 "application " | MEDICATED_PAD | CUTANEOUS | Status: DC | PRN

## 2017-10-01 MED ORDER — DIBUCAINE 1 % RE OINT: 1.0000 "application " | TOPICAL_OINTMENT | RECTAL | Status: DC | PRN

## 2017-10-01 MED ORDER — COCONUT OIL OIL: 1.0000 "application " | TOPICAL_OIL | Status: DC | PRN

## 2017-10-01 MED ORDER — LACTATED RINGERS IV SOLN: 500.0000 mL | Freq: Once | INTRAVENOUS | Status: DC

## 2017-10-01 MED ORDER — ONDANSETRON HCL 4 MG PO TABS: 4.0000 mg | ORAL_TABLET | ORAL | Status: DC | PRN

## 2017-10-01 MED ORDER — FENTANYL 2.5 MCG/ML BUPIVACAINE 1/10 % EPIDURAL INFUSION (WH - ANES)
14.0000 mL/h | INTRAMUSCULAR | Status: DC | PRN
  Administered 2017-10-01: 14 mL/h via EPIDURAL
  Filled 2017-10-01: qty 100

## 2017-10-01 MED ORDER — SENNOSIDES-DOCUSATE SODIUM 8.6-50 MG PO TABS
2.0000 | ORAL_TABLET | ORAL | Status: DC
  Administered 2017-10-01: 2 via ORAL
  Filled 2017-10-01: qty 2

## 2017-10-01 MED ORDER — ACETAMINOPHEN 325 MG PO TABS: 650.0000 mg | ORAL_TABLET | ORAL | Status: DC | PRN

## 2017-10-01 MED ORDER — OXYTOCIN BOLUS FROM INFUSION
500.0000 mL | Freq: Once | INTRAVENOUS | Status: AC
  Administered 2017-10-01: 500 mL via INTRAVENOUS

## 2017-10-01 MED ORDER — DIPHENHYDRAMINE HCL 25 MG PO CAPS: 25.0000 mg | ORAL_CAPSULE | Freq: Four times a day (QID) | ORAL | Status: DC | PRN

## 2017-10-01 MED ORDER — TERBUTALINE SULFATE 1 MG/ML IJ SOLN
0.2500 mg | Freq: Once | INTRAMUSCULAR | Status: DC | PRN
  Filled 2017-10-01: qty 1

## 2017-10-01 MED ORDER — PHENYLEPHRINE 40 MCG/ML (10ML) SYRINGE FOR IV PUSH (FOR BLOOD PRESSURE SUPPORT)
80.0000 ug | PREFILLED_SYRINGE | INTRAVENOUS | Status: DC | PRN
  Filled 2017-10-01: qty 5
  Filled 2017-10-01: qty 10

## 2017-10-01 MED ORDER — ONDANSETRON HCL 4 MG/2ML IJ SOLN
4.0000 mg | Freq: Four times a day (QID) | INTRAMUSCULAR | Status: DC | PRN
  Filled 2017-10-01: qty 2

## 2017-10-01 MED ORDER — ONDANSETRON HCL 4 MG/2ML IJ SOLN: 4.0000 mg | INTRAMUSCULAR | Status: DC | PRN

## 2017-10-01 MED ORDER — SOD CITRATE-CITRIC ACID 500-334 MG/5ML PO SOLN: 30.0000 mL | ORAL | Status: DC | PRN

## 2017-10-01 MED ORDER — OXYCODONE-ACETAMINOPHEN 5-325 MG PO TABS: 1.0000 | ORAL_TABLET | ORAL | Status: DC | PRN

## 2017-10-01 MED ORDER — LACTATED RINGERS IV SOLN: 500.0000 mL | INTRAVENOUS | Status: DC | PRN

## 2017-10-01 MED ORDER — DIPHENHYDRAMINE HCL 50 MG/ML IJ SOLN: 12.5000 mg | INTRAMUSCULAR | Status: DC | PRN

## 2017-10-01 MED ORDER — LIDOCAINE HCL (PF) 1 % IJ SOLN
30.0000 mL | INTRAMUSCULAR | Status: DC | PRN
  Administered 2017-10-01: 30 mL via SUBCUTANEOUS
  Filled 2017-10-01: qty 30

## 2017-10-01 MED ORDER — OXYCODONE-ACETAMINOPHEN 5-325 MG PO TABS: 2.0000 | ORAL_TABLET | ORAL | Status: DC | PRN

## 2017-10-01 MED ORDER — FERROUS SULFATE 325 (65 FE) MG PO TABS
325.0000 mg | ORAL_TABLET | Freq: Two times a day (BID) | ORAL | Status: DC
  Administered 2017-10-01 – 2017-10-02 (×2): 325 mg via ORAL
  Filled 2017-10-01 (×2): qty 1

## 2017-10-01 MED ORDER — SIMETHICONE 80 MG PO CHEW: 80.0000 mg | CHEWABLE_TABLET | ORAL | Status: DC | PRN

## 2017-10-01 NOTE — Plan of Care (Signed)
Progressing appropriately. No assistance needed for bathroom. Patient resting.

## 2017-10-01 NOTE — Anesthesia Postprocedure Evaluation (Signed)
Anesthesia Post Note  Patient: Ja Deah Tompson  Procedure(s) Performed: AN AD HOC LABOR EPIDURAL     Patient location during evaluation: Mother Baby Anesthesia Type: Epidural Level of consciousness: awake and alert and oriented Pain management: satisfactory to patient Vital Signs Assessment: post-procedure vital signs reviewed and stable Respiratory status: spontaneous breathing and nonlabored ventilation Cardiovascular status: stable Postop Assessment: no headache, no backache, no signs of nausea or vomiting, adequate PO intake and patient able to bend at knees (patient up walking) Anesthetic complications: no    Last Vitals:  Vitals:   10/01/17 0930 10/01/17 1040  BP: 120/65 128/76  Pulse: 67 66  Resp: 20 20  Temp: 36.9 C 37.2 C    Last Pain:  Vitals:   10/01/17 1227  TempSrc:   PainSc: 0-No pain   Pain Goal: Patients Stated Pain Goal: 6 (10/01/17 0113)               Madison HickmanGREGORY,Daneen Volcy

## 2017-10-01 NOTE — Progress Notes (Signed)
LABOR PROGRESS NOTE  Maria Simpson Maria Simpson is a 21 y.o. G2P1001 at 4916w4d  admitted for PROM around 22:00 last night.  Subjective: Patient just got epidural. Pain now well-controlled.  Objective: BP 133/84   Pulse 70   Temp 98.3 F (36.8 C) (Oral)   Resp 18   Ht 5\' 7"  (1.702 m)   Wt 280 lb (127 kg)   LMP 01/22/2017 (LMP Unknown)   BMI 43.85 kg/m  or  Vitals:   10/01/17 0530 10/01/17 0535 10/01/17 0540 10/01/17 0545  BP: 129/76 (!) 117/93 131/66 133/84  Pulse: 71 (!) 142 (!) 108 70  Resp: 16 18 20 18   Temp:      TempSrc:      Weight:      Height:        SVE at 05:40: Dilation: 5 Effacement (%): 70 Cervical Position: Middle Station: 0 Presentation: Vertex Exam by:: Maria Junes. Goodman, RN FHT: baseline rate 130, moderate varibility, +acel, early decels Toco: ctx q2 min  Assessment / Plan: 21 y.o. G2P1001 at 9516w4d here for IOL for PROM.  Labor: On IV Pitocin, now contracting regularly. Continue to titrate  Fetal Wellbeing:  Cat Pain Control:  Epidural Anticipated MOD:  SVD  Frederik PearJulie P Degele, MD 10/01/2017, 5:57 AM

## 2017-10-01 NOTE — Anesthesia Procedure Notes (Signed)
Epidural Patient location during procedure: OB Start time: 10/01/2017 5:06 AM End time: 10/01/2017 5:16 AM  Staffing Anesthesiologist: Leonides GrillsEllender, Devoiry Corriher P, MD Performed: anesthesiologist   Preanesthetic Checklist Completed: patient identified, site marked, pre-op evaluation, timeout performed, IV checked, risks and benefits discussed and monitors and equipment checked  Epidural Patient position: sitting Prep: DuraPrep Patient monitoring: heart rate, cardiac monitor, continuous pulse ox and blood pressure Approach: midline Location: L4-L5 Injection technique: LOR air  Needle:  Needle type: Tuohy  Needle gauge: 17 G Needle length: 9 cm Needle insertion depth: 8 cm Catheter type: closed end flexible Catheter size: 19 Gauge Catheter at skin depth: 13 cm Test dose: negative and Other  Assessment Events: blood not aspirated, injection not painful, no injection resistance and negative IV test  Additional Notes Informed consent obtained prior to proceeding including risk of failure, 1% risk of PDPH, risk of minor discomfort and bruising. Discussed alternatives to epidural analgesia and patient desires to proceed.  Timeout performed pre-procedure verifying patient name, procedure, and platelet count.  Patient tolerated procedure well. Reason for block:procedure for pain

## 2017-10-01 NOTE — Progress Notes (Signed)
POSTPARTUM PROGRESS NOTE  Post Partum Day 1 Subjective:  Maria Simpson is a 21 y.o. [id number] 6323w4d s/p SVD.  No acute events overnight.  Pt denies problems with ambulating, voiding or po intake.  She denies nausea or vomiting.  Pain is well controlled. Lochia Minimal.   Objective: Blood pressure 125/64, pulse (!) 59, temperature (!) 97.3 F (36.3 C), temperature source Oral, resp. rate 18, height 5\' 7"  (1.702 m), weight 272 lb 9.6 oz (123.7 kg), last menstrual period 01/22/2017, unknown if currently breastfeeding.  Physical Exam:  General: alert, cooperative and no distress Lochia:normal flow Chest: no respiratory distress Heart:regular rate, distal pulses intact Abdomen: soft, nontender,  Uterine Fundus: firm, appropriately tender DVT Evaluation: No calf swelling or tenderness Extremities: no edema  Recent Labs    10/01/17 0035  HGB 9.7*  HCT 30.5*    Assessment/Plan:  ASSESSMENT: Maria Simpson is a 21 y.o. [id number] 2723w4d s/p SVD.  Plan for discharge tomorrow   LOS: 1 day   Saralee Bolick MossDO 10/02/2017, 7:00 AM

## 2017-10-01 NOTE — H&P (Signed)
LABOR AND DELIVERY ADMISSION HISTORY AND PHYSICAL NOTE  Maria Simpson is a 21 y.o. female G2P1001 with IUP at 3916w3d by 21-wk U?S presenting for ROM around 10pm tonight. Having some contractions, but not regular.  She reports positive fetal movement. Denies vaginal bleeding.  [facility] Pregnancy complications:  - Late PNC starting at 21 weeks - Obesity  Past Medical History: Past Medical History:  Diagnosis Date  . Medical history non-contributory   . Menstrual cramps   . Urinary incontinence     Past Surgical History: Past Surgical History:  Procedure Laterality Date  . NO PAST SURGERIES      Obstetrical History: OB History    Gravida Para Term Preterm AB Living   2 1 1     1    SAB TAB Ectopic Multiple Live Births         0 1      Social History: Social History   Socioeconomic History  . Marital status: Single    Spouse name: None  . Number of children: None  . Years of education: None  . Highest education level: None  Social Needs  . Financial resource strain: None  . Food insecurity - worry: None  . Food insecurity - inability: None  . Transportation needs - medical: None  . Transportation needs - non-medical: None  Occupational History  . None  Tobacco Use  . Smoking status: Never Smoker  . Smokeless tobacco: Never Used  Substance and Sexual Activity  . Alcohol use: No  . Drug use: No  . Sexual activity: Yes    Birth control/protection: None  Other Topics Concern  . None  Social History Narrative   Exercise--- softball    Family History: Family History  Problem Relation Age of Onset  . Prostate cancer Unknown   . Diabetes Maternal Grandmother   . Diabetes Maternal Grandfather   . Heart disease Maternal Grandfather   . Hypertension Maternal Grandfather   . Cancer Neg Hx     Allergies: No Known Allergies  Medications Prior to Admission  Medication Sig Dispense Refill Last Dose  . Prenatal  Vit-Fe Fumarate-FA (PRENATAL MULTIVITAMIN) TABS tablet Take 1 tablet by mouth daily.    09/30/2017 at Unknown time  . esomeprazole (NEXIUM) 20 MG capsule Take 20 mg by mouth daily at 12 noon.   More than a month at Unknown time  . omeprazole (PRILOSEC) 40 MG capsule Take 1 capsule (40 mg total) by mouth daily. 30 capsule 1 Unknown at Unknown time     Review of Systems  All systems reviewed and negative except as stated in HPI  Physical Exam Blood pressure 125/85, pulse (!) 114, temperature 98.4 F (36.9 C), resp. rate 18, last menstrual period 01/22/2017, unknown if currently breastfeeding. General appearance: alert, oriented, NAD Lungs: normal respiratory effort Heart: regular rate Abdomen: soft, non-tender; gravid, FH appropriate for GA Extremities: No calf swelling or tenderness Presentation: cephalic by bedside U/S Fetal monitoring: baseline rate 140, moderate variability, no acel, no decel Uterine activity: UI    Prenatal labs: ABO, Rh: O/Positive/-- (08/23 1120) Antibody: Negative (08/23 1120) Rubella: 1.25 (08/23 1120) RPR: Non Reactive (10/23 1029)  HBsAg: Negative (08/23 1120)  HIV: Non Reactive (10/23 1029)  GC/Chlamydia: negative GBS:   negative 2-hr GTT: normal Genetic screening:  Negative Quad Anatomy US: Inadequate views of heart, face; 2nd US still inadequate view of lips and face.  Prenatal Transfer Tool  Maternal Diabetes: No Genetic Screening:  Normal Maternal Ultrasounds/Referrals: Normal Fetal Ultrasounds or other Referrals:  None Maternal Substance Abuse:  No Significant Maternal Medications:  None Significant Maternal Lab Results: None  Results for orders placed or performed during the hospital encounter of 09/30/17 (from the past 24 hour(s))  POCT fern test   Collection Time: 09/30/17 11:30 PM  Result Value Ref Range   POCT Fern Test Positive = ruptured amniotic membanes     Patient Active Problem List   Diagnosis Date Noted  . Supervision of  other normal pregnancy, antepartum 05/24/2017  . Obesity affecting pregnancy in second trimester 05/24/2017  . Late prenatal care 05/22/2017    Assessment: Maria Simpson is a [age] y.o. G2P1001 at 268w3d here for ROM at 10 pm tonight.   #Labor: Admit to L&D. Will start IV Pitocin if no SOL soon #Pain: Per patient's request #FWB: Cat I #ID:  GBS neg #MOF: undecided #MOC:Nexplanon #Circ:  N/A (girl)  Kandra NicolasJulie P Ursala Cressy 10/01/2017, 12:22 AM

## 2017-10-01 NOTE — Anesthesia Preprocedure Evaluation (Addendum)
Anesthesia Evaluation  Patient identified by MRN, date of birth, ID band Patient awake    Reviewed: Allergy & Precautions, NPO status , Patient's Chart, lab work & pertinent test results  Airway Mallampati: II  TM Distance: >3 FB Neck ROM: Full    Dental no notable dental hx.    Pulmonary neg pulmonary ROS,    Pulmonary exam normal breath sounds clear to auscultation       Cardiovascular negative cardio ROS Normal cardiovascular exam Rhythm:Regular Rate:Normal     Neuro/Psych negative neurological ROS  negative psych ROS   GI/Hepatic Neg liver ROS,   Endo/Other  Morbid obesity  Renal/GU negative Renal ROS     Musculoskeletal negative musculoskeletal ROS (+)   Abdominal (+) + obese,   Peds  Hematology  (+) anemia ,   Anesthesia Other Findings   Reproductive/Obstetrics (+) Pregnancy                            Anesthesia Physical  Anesthesia Plan  ASA: III  Anesthesia Plan: Epidural   Post-op Pain Management:    Induction: Intravenous  PONV Risk Score and Plan:   Airway Management Planned:   Additional Equipment:   Intra-op Plan:   Post-operative Plan:   Informed Consent: I have reviewed the patients History and Physical, chart, labs and discussed the procedure including the risks, benefits and alternatives for the proposed anesthesia with the patient or authorized representative who has indicated his/her understanding and acceptance.   Dental advisory given  Plan Discussed with: CRNA  Anesthesia Plan Comments: (Informed consent obtained prior to proceeding including risk of failure, 1% risk of PDPH, risk of minor discomfort and bruising. Discussed alternatives to epidural analgesia and patient desires to proceed.  Timeout performed pre-procedure verifying patient name, procedure, and platelet count.  Patient tolerated procedure well. )        Anesthesia Quick  Evaluation

## 2017-10-02 MED ORDER — IBUPROFEN 800 MG PO TABS: 800.0000 mg | ORAL_TABLET | Freq: Three times a day (TID) | ORAL | 0 refills | Status: DC | PRN

## 2017-10-02 NOTE — Discharge Summary (Signed)
OB Discharge Summary     Patient Name: Maria Simpson DOB: March 28, 1996 MRN: [id number]  Date of admission: 09/30/2017 Delivering MD: Frederik Pear   Date of discharge: 10/02/2017  Admitting diagnosis: 39 WEEKS CTX ROM Intrauterine pregnancy: [redacted]w[redacted]d     Secondary diagnosis:  Principal Problem:   SVD (spontaneous vaginal delivery) Active Problems:   Obesity affecting pregnancy in second trimester   Late prenatal care   PROM (premature rupture of membranes)  Additional problems:      Discharge diagnosis: Term Pregnancy Delivered                                                                                                Post partum procedures:none  Augmentation: Pitocin  Complications: None  Hospital course:  Induction of Labor With Vaginal Delivery   22 y.o. yo [id number] at [redacted]w[redacted]d was admitted to the hospital 09/30/2017 for induction of labor.  Indication for induction: PROM.  Patient had an uncomplicated labor course as follows: Membrane Rupture Time/Date: 10:00 PM ,09/30/2017   Intrapartum Procedures: Episiotomy: None [1]                                         Lacerations:  1st degree [2];Perineal [11];Labial [10]  Patient had delivery of a Viable infant.  Information for the patient's newborn:  Amelya, Mabry [[id number]]  Delivery Method: Vag-Spont   10/01/2017  Details of delivery can be found in separate delivery note.  Patient had a routine postpartum course. Patient is discharged home 10/02/17.  Physical exam  Vitals:   10/01/17 1853 10/01/17 2310 10/02/17 0500 10/02/17 0517  BP: 137/70 130/60  125/64  Pulse: 63 75  (!) 59  Resp: 18 18  18   Temp: 97.6 F (36.4 C) 98.1 F (36.7 C)  (!) 97.3 F (36.3 C)  TempSrc: Oral Oral  Oral  Weight:   272 lb 9.6 oz (123.7 kg)   Height:       General: alert, cooperative and no distress Lochia: appropriate Uterine Fundus: firm Incision: N/A DVT Evaluation: No evidence of DVT seen on physical exam. Labs: Lab  Results  Component Value Date   WBC 12.2 (H) 10/01/2017   HGB 9.7 (L) 10/01/2017   HCT 30.5 (L) 10/01/2017   MCV 74.2 (L) 10/01/2017   PLT 379 10/01/2017   CMP Latest Ref Rng & Units 02/18/2014  Glucose 70 - 99 mg/dL 73  BUN 6 - 23 mg/dL 7  Creatinine 0.4 - 1.2 mg/dL 0.8  Sodium 130 - 865 mEq/L 139  Potassium 3.5 - 5.1 mEq/L 3.9  Chloride 96 - 112 mEq/L 103  CO2 19 - 32 mEq/L 28  Calcium 8.4 - 10.5 mg/dL 9.2  Total Protein 6.0 - 8.3 g/dL 7.5  Total Bilirubin 0.2 - 0.8 mg/dL 0.4  Alkaline Phos 47 - 119 U/L 70  AST 0 - 37 U/L 16  ALT 0 - 35 U/L 13    Discharge instruction: per After Visit Summary and "Baby and  Me Booklet".  After visit meds:  Allergies as of 10/02/2017   No Known Allergies     Medication List    TAKE these medications   ibuprofen 800 MG tablet Commonly known as:  ADVIL,MOTRIN Take 1 tablet (800 mg total) by mouth every 8 (eight) hours as needed.   prenatal multivitamin Tabs tablet Take 1 tablet by mouth daily.       Diet: routine diet  Activity: Advance as tolerated. Pelvic rest for 6 weeks.   Outpatient follow up:6 weeks Follow up Appt:No future appointments. Follow up Visit:No Follow-up on file.  Postpartum contraception: Nexplanon  Newborn Data: Live born female  Birth Weight: 9 lb 5.2 oz (4230 g) APGAR: 8, 9  Newborn Delivery   Birth date/time:  10/01/2017 07:14:00 Delivery type:  Vaginal, Spontaneous     Baby Feeding: Breast Disposition:home with mother   10/02/2017 Thressa ShellerHeather Teruo Stilley, CNM

## 2017-10-04 ENCOUNTER — Encounter: Payer: 59 | Admitting: Family Medicine

## 2017-10-11 ENCOUNTER — Inpatient Hospital Stay (HOSPITAL_COMMUNITY): Payer: 59

## 2017-10-25 ENCOUNTER — Encounter: Payer: Self-pay | Admitting: Family Medicine

## 2017-10-25 ENCOUNTER — Other Ambulatory Visit (HOSPITAL_COMMUNITY)
Admission: RE | Admit: 2017-10-25 | Discharge: 2017-10-25 | Disposition: A | Discharge status: Home / Self Care | Payer: 59 | Source: Ambulatory Visit | Attending: Family Medicine | Admitting: Family Medicine

## 2017-10-25 ENCOUNTER — Ambulatory Visit (INDEPENDENT_AMBULATORY_CARE_PROVIDER_SITE_OTHER): Payer: 59 | Admitting: Family Medicine

## 2017-10-25 VITALS — BP 110/66 | HR 74 | Wt 259.0 lb

## 2017-10-25 DIAGNOSIS — Z1389 Encounter for screening for other disorder: Secondary | ICD-10-CM

## 2017-10-25 DIAGNOSIS — Z3202 Encounter for pregnancy test, result negative: Secondary | ICD-10-CM | POA: Diagnosis not present

## 2017-10-25 DIAGNOSIS — Z01812 Encounter for preprocedural laboratory examination: Secondary | ICD-10-CM

## 2017-10-25 LAB — POCT URINE PREGNANCY: PREG TEST UR: NEGATIVE

## 2017-10-25 NOTE — Progress Notes (Signed)
Post Partum Exam  Maria Simpson is a 22 y.o. 642P2002 female who presents for a postpartum visit. She is 4 weeks postpartum following a spontaneous vaginal delivery. I have fully reviewed the prenatal and intrapartum course. The delivery was at 40 gestational weeks.  Anesthesia: epidural. Postpartum course has been unremarkable. Baby's course has been unremarkable. Baby is feeding by bottle- Gerber Gentle. Bleeding staining only. Bowel function is normal. Bladder function is normal. Patient is not sexually active. Contraception method is Nexplanon. Postpartum depression screening:neg  The following portions of the patient's history were reviewed and updated as appropriate: allergies, current medications, past family history, past medical history, past social history, past surgical history and problem list.  Last pap smear done - never done as patient just turned 22.   Review of Systems Pertinent items are noted in HPI.    Objective:  Weight 259 lb (117.5 kg), unknown if currently breastfeeding.  General:  alert, cooperative and no distress  Lungs: clear to auscultation bilaterally  Heart:  regular rate and rhythm, S1, S2 normal, no murmur, click, rub or gallop  Abdomen: soft, non-tender; bowel sounds normal; no masses,  no organomegaly   Vulva:  normal  Vagina: normal vagina, no discharge, exudate, lesion, or erythema  Cervix:  multiparous appearance        Nexplanon Insertion:  Patient given informed consent, signed copy in the chart, time out was performed. Pregnancy test was negative. Appropriate time out taken.  Patient's left arm was prepped and draped in the usual sterile fashion.. The ruler used to measure and mark insertion area.  Pt was prepped with alcohol swab and then injected with 3 cc of 1% lidocaine with epinephrine.  Pt was prepped with betadine, Implanon removed form packaging,  Device confirmed in needle, then inserted full length of needle and withdrawn per handbook  instructions.  Device palpated by physician and patient.  Pt insertion site covered with pressure dressing.   Minimal blood loss.  Pt tolerated the procedure well.    Assessment:    Normal postpartum exam. Pap smear done at today's visit.   Plan:   1. Contraception: Nexplanon 2. Follow up in: 3 months or as needed.

## 2017-11-01 LAB — CYTOLOGY - PAP
DIAGNOSIS: NEGATIVE
Diagnosis: REACTIVE

## 2019-02-23 IMAGING — US US MFM OB FOLLOW-UP
1 series · 14 of 28 positions shown · non-contrast
Comparison: none

[Series 1: us mfm ob follow-up · 34 acquisitions, 14 frames shown]
[im 2/34]
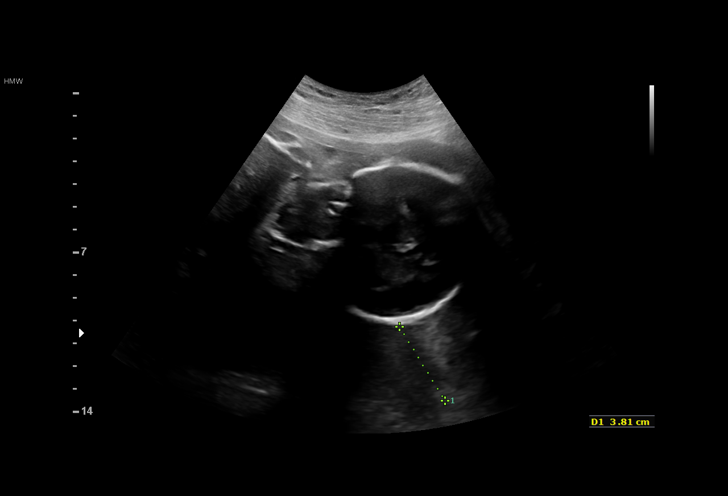
[im 4/34]
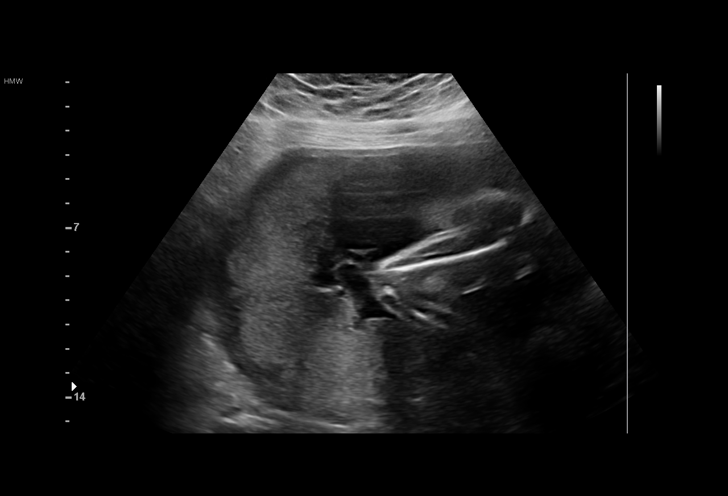
[im 7/34]
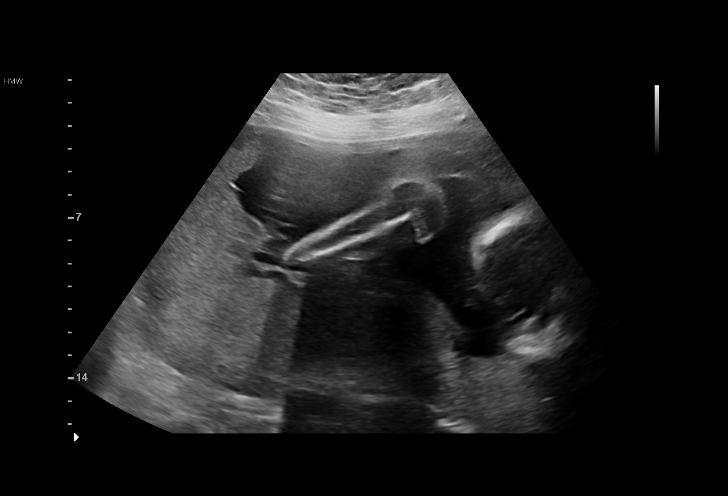
[im 9/34]
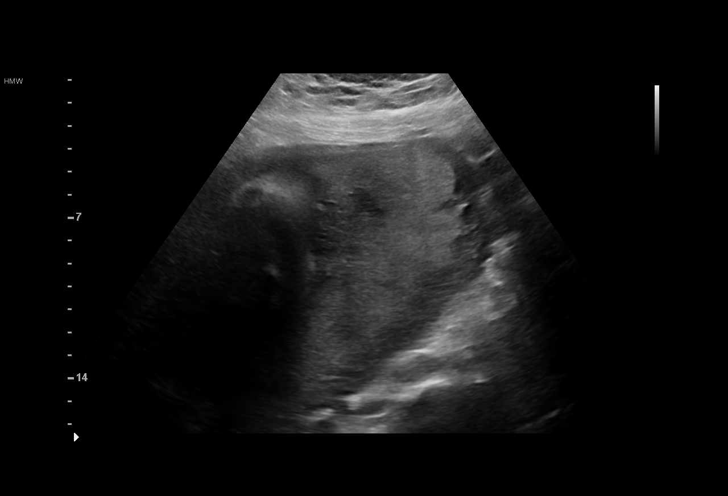
[im 12/34]
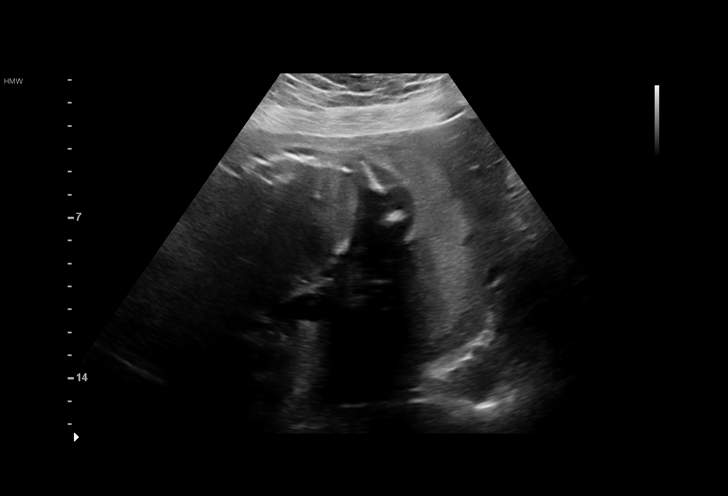
[im 14/34]
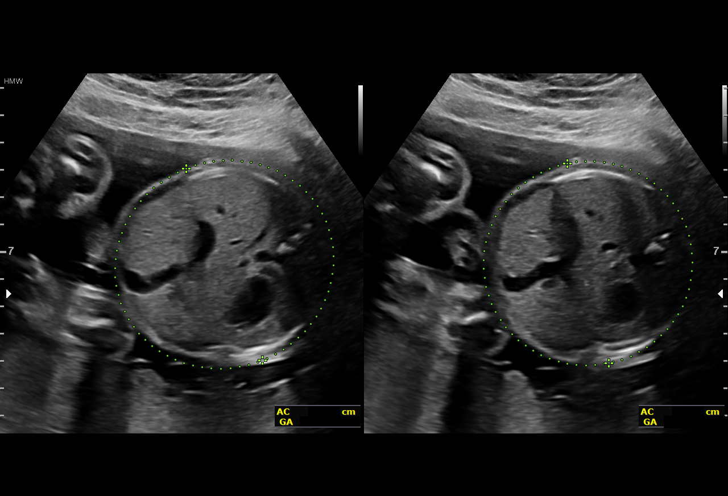
[im 16/34]
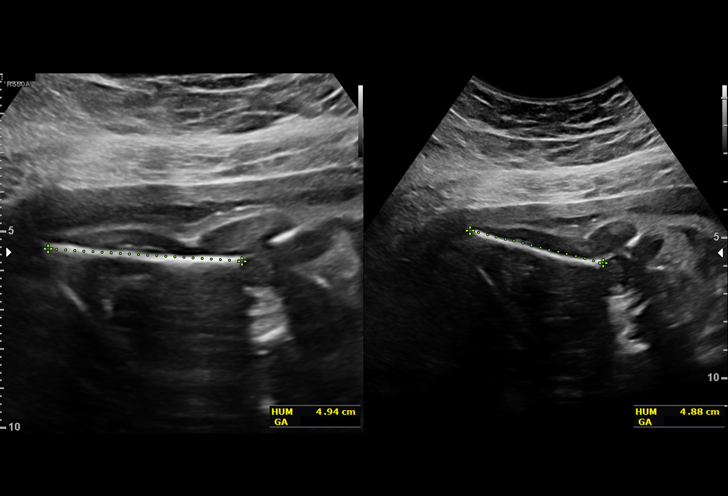
[im 19/34]
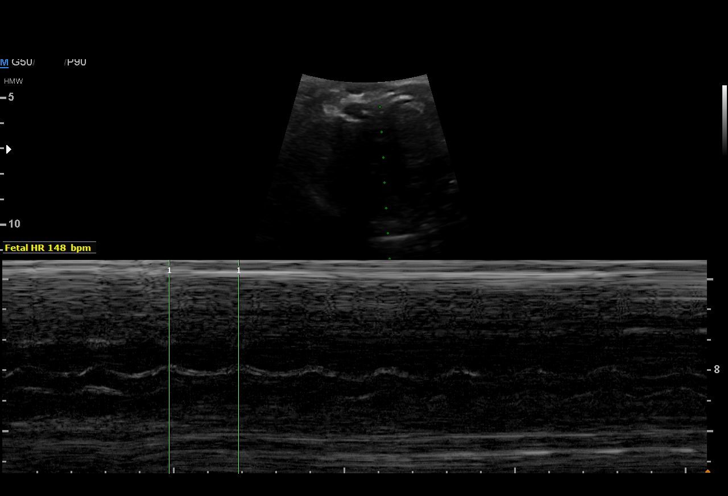
[im 21/34]
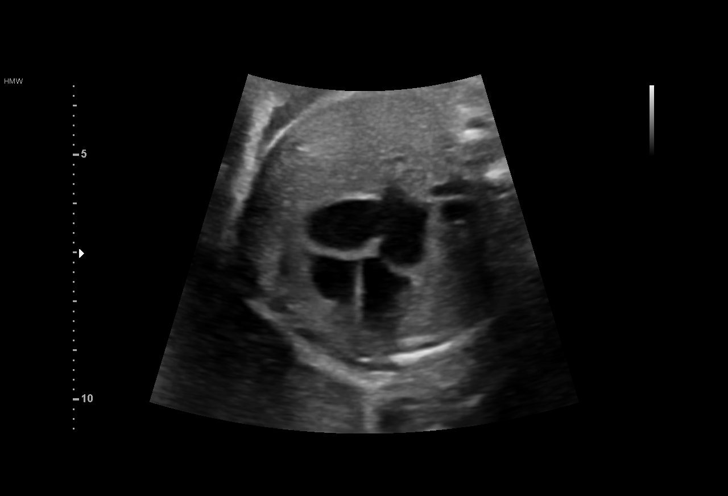
[im 24/34]
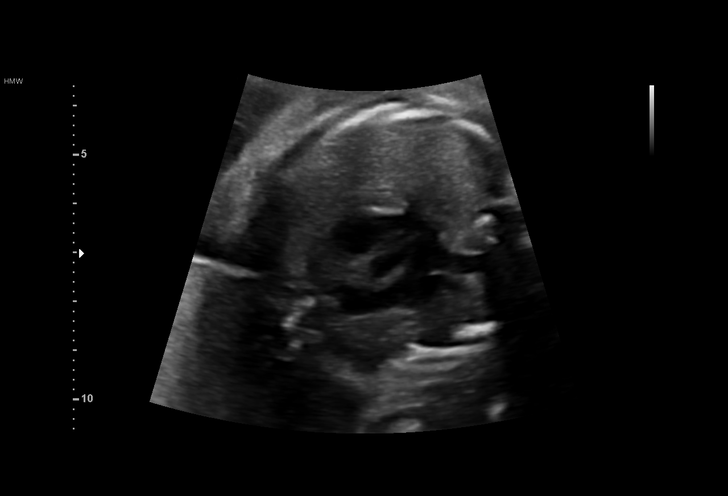
[im 26/34]
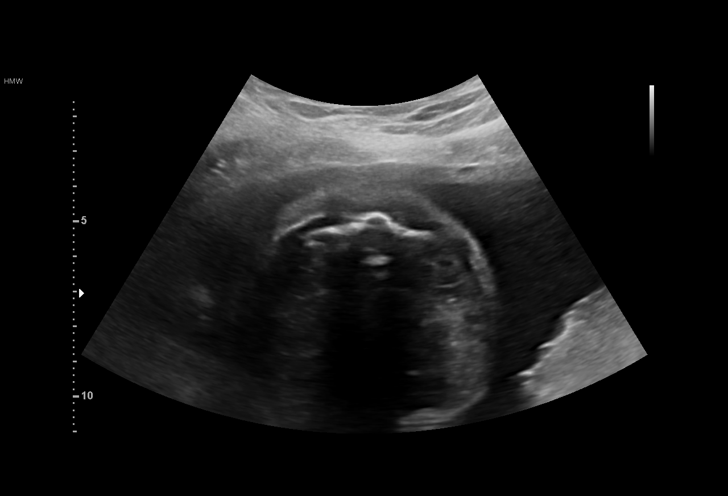
[im 29/34]
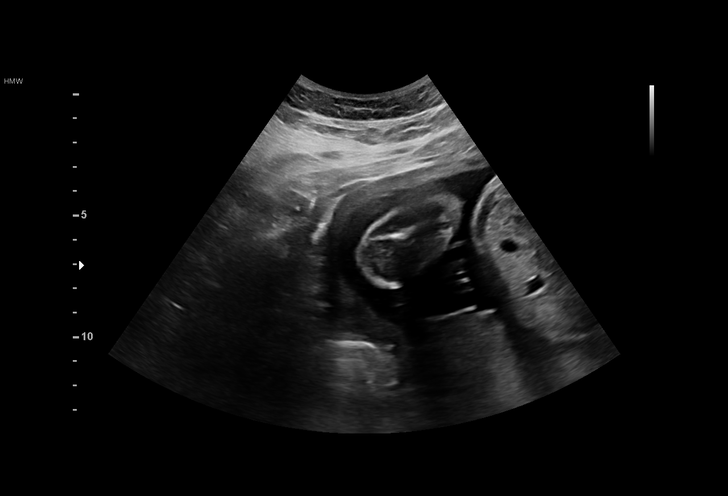
[im 31/34]
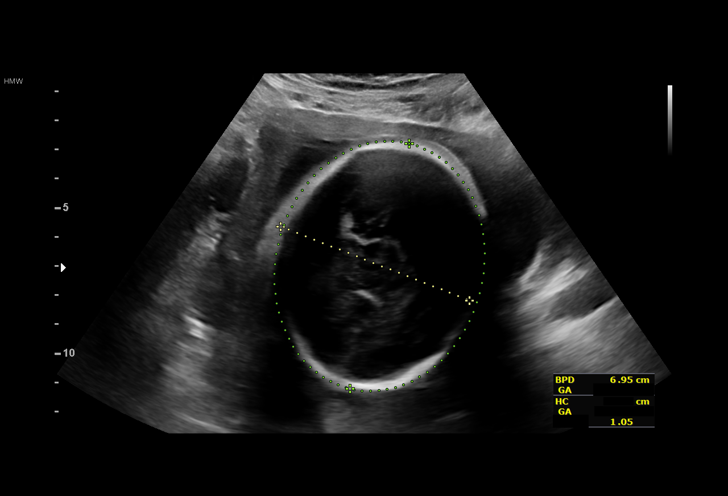
[im 34/34]
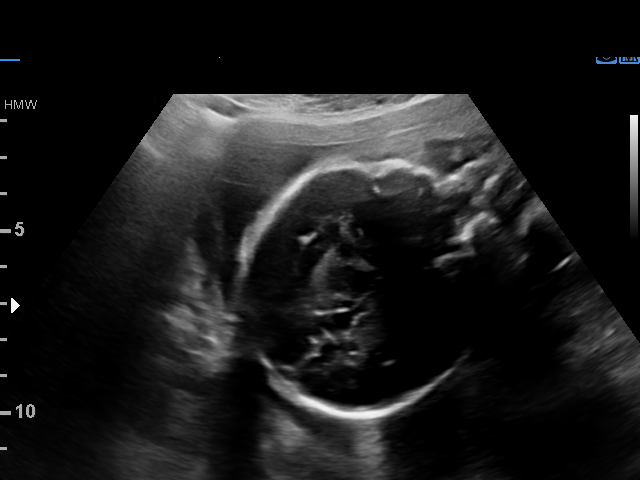

[14 of 28 positions shown; findings below may reference images not displayed]

RAZA
Indications

26 weeks gestation of pregnancy
Obesity complicating pregnancy, second
trimester
Encounter for antenatal screening for
malformations
OB History

Blood Type:            Height:  5'7"   Weight (lb):  265       BMI:
Gravidity:    2         Term:   1
Living:       1
Fetal Evaluation

Num Of Fetuses:     1
Fetal Heart         148
Rate(bpm):
Cardiac Activity:   Observed
Presentation:       Cephalic
Placenta:           Posterior, above cervical os
P. Cord Insertion:  Visualized, central

Amniotic Fluid
AFI FV:      Subjectively within normal limits

Largest Pocket(cm)
4.2
Biometry
BPD:      68.3  mm     G. Age:  27w 3d         66  %    CI:        79.92   %    70 - 86
FL/HC:      21.0   %    18.6 -
HC:      241.4  mm     G. Age:  26w 2d         12  %    HC/AC:      1.00        1.05 -
AC:      241.4  mm     G. Age:  28w 3d         87  %    FL/BPD:     74.2   %    71 - 87
FL:       50.7  mm     G. Age:  27w 1d         50  %    FL/AC:      21.0   %    20 - 24
HUM:      49.1  mm     G. Age:  29w 0d         91  %

Est. FW:    5556  gm      2 lb 7 oz     75  %
Gestational Age

LMP:           23w 1d        Date:  01/22/17                 EDD:   10/29/17
U/S Today:     27w 2d                                        EDD:   09/30/17
Best:          26w 5d     Det. By:  Previous Ultrasound      EDD:   10/04/17
(05/24/17)
Anatomy

Cranium:               Appears normal         Aortic Arch:            Previously seen
Cavum:                 Appears normal         Ductal Arch:            Previously seen
Ventricles:            Previously seen        Diaphragm:              Previously seen
Choroid Plexus:        Previously seen        Stomach:                Appears normal, left
sided
Cerebellum:            Previously seen        Abdomen:                Previously seen
Posterior Fossa:       Previously seen        Abdominal Wall:         Previously seen
Nuchal Fold:           Not applicable (>20    Cord Vessels:           Previously seen
wks GA)
Face:                  Not well visualized    Kidneys:                Appear normal
Lips:                  Not well visualized    Bladder:                Appears normal
Thoracic:              Appears normal         Spine:                  Previously seen
Heart:                 Appears normal         Upper Extremities:      Previously seen
(4CH, axis, and situs
RVOT:                  Appears normal         Lower Extremities:      Previously seen
LVOT:                  Previously seen

Other:  Female gender previously seen. Technically difficult due to fetal
position. Technically difficult due to maternal habitus.
Cervix Uterus Adnexa

Cervix
Length:            3.8  cm.
Normal appearance by transabdominal scan.

Uterus
No abnormality visualized.

Left Ovary
No adnexal mass visualized.

Right Ovary
No adnexal mass visualized.

Cul De Sac:   No free fluid seen.

Adnexa:       No abnormality visualized.
Impression

Singleton intrauterine pregnancy at 26 weeks 5 days
gestation with fetal cardiac activity
Cephalic presentation
Posterior placenta without evidence of previa
Normal appearing fetal growth and amniotic fluid volume
Normal appearing cervical length
Fetal face and lips still not well visualized secondary to
maternal habitus
Recommendations

Patient and provider to decide if the desire further evaluation
of fetal anatomy in the limited fetal anatomic survey today

## 2020-03-21 ENCOUNTER — Inpatient Hospital Stay: Admit: 2020-03-21 | Discharge: 2020-03-21 | Disposition: A | Discharge status: Home / Self Care

## 2020-03-21 DIAGNOSIS — R519 Headache, unspecified: Secondary | ICD-10-CM

## 2020-03-21 LAB — CBC WITH AUTO DIFFERENTIAL
Absolute Baso #: 0.1 10*3/uL (ref 0.0–0.2)
Absolute Eos #: 0.1 10*3/uL (ref 0.0–0.5)
Absolute Lymph #: 4 10*3/uL (ref 1.0–4.3)
Absolute Mono #: 0.7 10*3/uL (ref 0.0–0.8)
Absolute Neut #: 3.4 10*3/uL (ref 1.8–7.0)
Basophils: 0.9 % (ref 0.0–2.0)
Eosinophils: 1 % (ref 1.0–6.0)
Granulocytes %: 41.5 % (ref 40.0–80.0)
Hematocrit: 42.1 % (ref 35.0–47.0)
Hemoglobin: 14.1 g/dL (ref 11.7–16.0)
Lymphocyte %: 48.5 % — ABNORMAL HIGH (ref 20.0–40.0)
MCH: 29.3 pg (ref 26.0–34.0)
MCHC: 33.5 % (ref 32.0–36.0)
MCV: 87.4 fL (ref 79.0–98.0)
MPV: 7.2 fL — ABNORMAL LOW (ref 7.4–10.4)
Monocytes: 8.1 % (ref 2.0–10.0)
Platelets: 418 10*3/uL (ref 140–440)
RBC: 4.82 10*6/uL (ref 3.80–5.20)
RDW: 14.7 % — ABNORMAL HIGH (ref 11.5–14.5)
WBC: 8.3 10*3/uL (ref 3.6–10.7)

## 2020-03-21 LAB — COVID-19, RAPID

## 2020-03-21 LAB — BASIC METABOLIC PANEL
Anion Gap: 8 mmol/L (ref 3–13)
BUN: 12 mg/dL (ref 7–20)
CO2: 25 mmol/L (ref 22–30)
Calcium: 9.8 mg/dL (ref 8.4–10.4)
Chloride: 108 mmol/L — ABNORMAL HIGH (ref 98–107)
Creatinine: 0.77 mg/dL (ref 0.52–1.25)
EGFR IF NonAfrican American: >90 mL/min (ref >60)
Glucose: 93 mg/dL (ref 70–100)
Potassium: 3.7 mmol/L (ref 3.5–5.1)
Sodium: 141 mmol/L (ref 135–145)
eGFR African American: >90 mL/min (ref >60)

## 2020-03-21 LAB — HCG, SERUM, QUALITATIVE: hCG Qual: NEGATIVE NA

## 2020-03-21 MED ORDER — KETOROLAC TROMETHAMINE 30 MG/ML IJ SOLN
30 MG/ML | Freq: Once | INTRAMUSCULAR | Status: AC
Start: 2020-03-21 — End: 2020-03-21
  Administered 2020-03-21: 21:00:00 30 mg via INTRAVENOUS

## 2020-03-21 MED ORDER — ONDANSETRON 4 MG PO TBDP
4 MG | ORAL_TABLET | Freq: Three times a day (TID) | ORAL | 0 refills | Status: AC | PRN
Start: 2020-03-21 — End: ?

## 2020-03-21 MED ORDER — SODIUM CHLORIDE 0.9 % IV BOLUS
0.9 % | Freq: Once | INTRAVENOUS | Status: AC
Start: 2020-03-21 — End: 2020-03-21
  Administered 2020-03-21: 21:00:00 1000 mL via INTRAVENOUS

## 2020-03-21 MED ORDER — METOCLOPRAMIDE HCL 5 MG/ML IJ SOLN
5 MG/ML | Freq: Once | INTRAMUSCULAR | Status: AC
Start: 2020-03-21 — End: 2020-03-21
  Administered 2020-03-21: 21:00:00 10 mg via INTRAVENOUS

## 2020-03-21 MED ORDER — ONDANSETRON HCL 4 MG/2ML IJ SOLN
4 MG/2ML | Freq: Once | INTRAMUSCULAR | Status: AC
Start: 2020-03-21 — End: 2020-03-21
  Administered 2020-03-21: 21:00:00 4 mg via INTRAVENOUS

## 2020-03-21 MED ORDER — BUTALBITAL-APAP-CAFFEINE 50-325-40 MG PO TABS
50-325-40 MG | ORAL_TABLET | ORAL | 0 refills | Status: AC | PRN
Start: 2020-03-21 — End: ?

## 2020-03-21 MED ORDER — DIPHENHYDRAMINE HCL 50 MG/ML IJ SOLN
50 MG/ML | Freq: Once | INTRAMUSCULAR | Status: AC
Start: 2020-03-21 — End: 2020-03-21
  Administered 2020-03-21: 21:00:00 25 mg via INTRAVENOUS

## 2020-03-21 MED FILL — KETOROLAC TROMETHAMINE 30 MG/ML IJ SOLN: 30 mg/mL | INTRAMUSCULAR | Qty: 1

## 2020-03-21 MED FILL — METOCLOPRAMIDE HCL 5 MG/ML IJ SOLN: 5 mg/mL | INTRAMUSCULAR | Qty: 2

## 2020-03-21 MED FILL — ONDANSETRON HCL 4 MG/2ML IJ SOLN: 4 MG/2ML | INTRAMUSCULAR | Qty: 2

## 2020-03-21 MED FILL — DIPHENHYDRAMINE HCL 50 MG/ML IJ SOLN: 50 mg/mL | INTRAMUSCULAR | Qty: 1

## 2020-08-06 ENCOUNTER — Inpatient Hospital Stay
Admit: 2020-08-06 | Discharge: 2020-08-06 | Disposition: A | Discharge status: Home / Self Care | Attending: Emergency Medicine

## 2020-08-06 DIAGNOSIS — W503XXA Accidental bite by another person, initial encounter: Secondary | ICD-10-CM

## 2020-08-06 DIAGNOSIS — S61259A Open bite of unspecified finger without damage to nail, initial encounter: Secondary | ICD-10-CM

## 2020-08-06 MED ORDER — AMOXICILLIN-POT CLAVULANATE 875-125 MG PO TABS
875-125 MG | Freq: Once | ORAL | Status: AC
Start: 2020-08-06 — End: 2020-08-06
  Administered 2020-08-06: 18:00:00 1 via ORAL

## 2020-08-06 MED ORDER — AMOXICILLIN-POT CLAVULANATE 875-125 MG PO TABS
875-125 MG | ORAL_TABLET | Freq: Two times a day (BID) | ORAL | 0 refills | Status: AC
Start: 2020-08-06 — End: 2020-08-09

## 2020-08-06 MED ORDER — IBUPROFEN 600 MG PO TABS
600 MG | Freq: Once | ORAL | Status: AC
Start: 2020-08-06 — End: 2020-08-06
  Administered 2020-08-06: 18:00:00 600 mg via ORAL

## 2020-08-06 MED ORDER — AMOXICILLIN-POT CLAVULANATE 875-125 MG PO TABS
875-125 MG | Freq: Two times a day (BID) | ORAL | Status: DC
Start: 2020-08-06 — End: 2020-08-06

## 2020-08-06 MED ORDER — BACITRACIN 500 UNIT/GM EX OINT
500 UNIT/GM | Freq: Once | CUTANEOUS | Status: AC
Start: 2020-08-06 — End: 2020-08-06
  Administered 2020-08-06: 18:00:00 via TOPICAL

## 2020-08-06 MED FILL — AMOXICILLIN-POT CLAVULANATE 875-125 MG PO TABS: 875-125 mg | ORAL | Qty: 1

## 2020-08-06 MED FILL — IBUPROFEN 600 MG PO TABS: 600 mg | ORAL | Qty: 1

## 2020-08-06 MED FILL — BACITRACIN 500 UNIT/GM EX OINT: 500 UNIT/GM | CUTANEOUS | Qty: 14

## 2020-08-06 NOTE — Discharge Instructions (Signed)
Wash wound gently with soap and water like normal, then apply over-the-counter antibiotic ointment, bandage, and finger splint until healed.  Over-the-counter Tylenol or Advil if needed.  Expect slow steady improvement.
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
## 2020-08-06 NOTE — Other (Unsigned)
Patient Acct Nbr: [id number]   Primary AUTH/CERT:   Primary Insurance Company Name: [employer] Plan name: [facility] HMO [facility]  Primary Insurance Group Number: [id number]  Primary Insurance Plan Type: Health  Primary Insurance Policy Number: [id number]
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
## 2021-04-02 DIAGNOSIS — S61422A Laceration with foreign body of left hand, initial encounter: Secondary | ICD-10-CM

## 2021-04-02 NOTE — ED Provider Notes (Signed)
[facility], [facility] ED  EMERGENCY DEPARTMENT ENCOUNTER      Pt Name: Jamie Ray  MRN: [id number]  Birthdate October 10, 1995  Date of evaluation: 04/02/2021  Provider: Donald Prose, PA     CHIEF COMPLAINT       Chief Complaint   Patient presents with   ??? Hand Laceration     Right hand; Knuckle area to between the ring and pinky finger; Small area on the pinky knuckle as well; Patient cut it on window 1 hr ago; Bleeding controlled       HISTORY OF PRESENT ILLNESS   (Location/Symptom, Timing/Onset, Context/Setting, Quality,Duration, Modifying Factors, Severity) Note limiting factors.   HPI   I have seen this patient I have evaluated this patient on my own, per my scope of practice with an attending available for consultation    Does this patient come from an ECF, SNF, Rehab, Group Home or other Congregate setting: no  (If yes to above patient needs a Covid-19 test)    Jamie Ray is a 25 y.o. female who presents to the emergency department for chief complaint of having injury with laceration to the patient's right hand after she punched a window.  Patient states that her tetanus shot was been updated within the past 5 years.  Patient denies any numbness in her fingers.  Denies any pregnant or breast-feeding.  Rated the pain 10 out of 10 aching pain is worse with movement.  Patient has no other complaints at this time      REVIEW OF SYSTEMS    (2+ for level 4; 10+ for level 5)   Review of Systems   Constitutional: Negative for chills, fatigue and fever.   HENT: Negative for congestion, sinus pain, sore throat and voice change.    Eyes: Negative.    Respiratory: Negative for cough, shortness of breath and wheezing.    Cardiovascular: Negative for chest pain, palpitations and leg swelling.   Gastrointestinal: Negative for abdominal distention, abdominal pain, blood in stool, constipation, diarrhea, nausea and vomiting.   Endocrine: Negative.    Genitourinary: Negative for dysuria, frequency and hematuria.   Musculoskeletal:  Negative for arthralgias and neck stiffness.   Skin: Positive for wound. Negative for color change, pallor and rash.   Neurological: Negative for dizziness, weakness, light-headedness, numbness and headaches.   Psychiatric/Behavioral: Negative.        PAST MEDICAL HISTORY   History reviewed. No pertinent past medical history.    SURGICAL HISTORY     History reviewed. No pertinent surgical history.    CURRENT MEDICATIONS       Previous Medications    BUTALBITAL-ACETAMINOPHEN-CAFFEINE (FIORICET, ESGIC) 50-325-40 MG PER TABLET    Take 1 tablet by mouth every 4 hours as needed for Headaches    ONDANSETRON (ZOFRAN ODT) 4 MG DISINTEGRATING TABLET    Take 1 tablet by mouth every 8 hours as needed for Nausea or Vomiting       ALLERGIES     Patient has no known allergies.    FAMILY HISTORY     History reviewed. No pertinent family history.     SOCIAL HISTORY       Social History     Socioeconomic History   ??? Marital status: Single     Spouse name: None   ??? Number of children: None   ??? Years of education: None   ??? Highest education level: None   Occupational History   ??? None   Tobacco Use   ???  Smoking status: Never Smoker   ??? Smokeless tobacco: Never Used   Vaping Use   ??? Vaping Use: Never used   Substance and Sexual Activity   ??? Alcohol use: Never   ??? Drug use: Never   ??? Sexual activity: None   Other Topics Concern   ??? None   Social History Narrative   ??? None     Social Determinants of Health     Financial Resource Strain:    ??? Difficulty of Paying Living Expenses: Not on file   Food Insecurity:    ??? Worried About Programme researcher, broadcasting/film/video in the Last Year: Not on file   ??? Ran Out of Food in the Last Year: Not on file   Transportation Needs:    ??? Lack of Transportation (Medical): Not on file   ??? Lack of Transportation (Non-Medical): Not on file   Physical Activity:    ??? Days of Exercise per Week: Not on file   ??? Minutes of Exercise per Session: Not on file   Stress:    ??? Feeling of Stress : Not on file   Social Connections:    ???  Frequency of Communication with Friends and Family: Not on file   ??? Frequency of Social Gatherings with Friends and Family: Not on file   ??? Attends Religious Services: Not on file   ??? Active Member of Clubs or Organizations: Not on file   ??? Attends Banker Meetings: Not on file   ??? Marital Status: Not on file   Intimate Partner Violence:    ??? Fear of Current or Ex-Partner: Not on file   ??? Emotionally Abused: Not on file   ??? Physically Abused: Not on file   ??? Sexually Abused: Not on file   Housing Stability:    ??? Unable to Pay for Housing in the Last Year: Not on file   ??? Number of Places Lived in the Last Year: Not on file   ??? Unstable Housing in the Last Year: Not on file       SCREENINGS    Glasgow Coma Scale  Eye Opening: Spontaneous  Best Verbal Response: Oriented  Best Motor Response: Obeys commands  Glasgow Coma Scale Score: 15    Patient symptoms are consistent with sepsis, severe sepsis or septic shock (if yes, use "use sepsis core measure"): no  PHYSICAL EXAM    (up to 7 forlevel 4, 8 or more for level 5)     ED Triage Vitals   BP Temp Temp Source Heart Rate Resp SpO2 Height Weight   04/02/21 2137 04/02/21 2137 04/02/21 2137 04/02/21 2137 04/02/21 2142 -- -- --   121/83 98.8 ??F (37.1 ??C) Oral 92 16          Physical Exam  Constitutional:       General: She is not in acute distress.     Appearance: She is well-developed. She is not diaphoretic.   HENT:      Head: Normocephalic.      Mouth/Throat:      Pharynx: No oropharyngeal exudate.   Eyes:      General: No scleral icterus.        Right eye: No discharge.         Left eye: No discharge.      Conjunctiva/sclera: Conjunctivae normal.   Cardiovascular:      Rate and Rhythm: Normal rate and regular rhythm.      Heart sounds: No friction  rub. No gallop.       Comments: Normal capillary fill  Pulmonary:      Effort: Pulmonary effort is normal. No respiratory distress.      Breath sounds: Normal breath sounds. No stridor. No wheezing or rales.    Chest:      Chest wall: No tenderness.   Abdominal:      General: Bowel sounds are normal. There is no distension.      Palpations: Abdomen is soft.      Tenderness: There is no abdominal tenderness. There is no guarding or rebound.   Musculoskeletal:         General: Tenderness present. Normal range of motion.      Cervical back: Neck supple.      Comments: Tenderness palpation and movement within the patient's fingers more within the  fourth and fifth digit.  Able make a fist able to perform extension of the fingers, tenderness appear to be intact   Skin:     General: Skin is warm and dry.      Coloration: Skin is not pale.      Findings: No erythema or rash.          Neurological:      General: No focal deficit present.      Mental Status: She is oriented to person, place, and time.      Comments: Normal sensation well patient distal extremity   Psychiatric:         Mood and Affect: Mood normal.         Behavior: Behavior normal.         Thought Content: Thought content normal.         Judgment: Judgment normal.         DIAGNOSTIC RESULTS     EKG (Per Emergency Physician):   Not clinically indicated    Interpretation per the Radiologist below, if available at the time of thisnote:  XR HAND RIGHT (MIN 3 VIEWS)    Result Date: 04/02/2021  Patient Name:                YASLIN, KIRTLEY MRN:                         [id number] Acct#:                       [id number] Diagnostic Radiology ACCESSION                 EXAM DATE/TIME           PROCEDURE                 ORDERING PROVIDER [phone]             04/02/2021 23:30 EDT       CR Hand Complete 3+       Ginny Forth M Views Right CPT code 78295 Reason For Exam (CR Hand Complete 3+ Views Right) eval for FB such as glass Report RIGHT HAND 3 VIEWS CLINICAL INDICATION: eval for FB such as glass TECHNIQUE: 3 views of the right hand. COMPARISON: 5 November, 2021. FINDINGS: No acute fracture or dislocation. Joint spaces maintained. Very small and amorphous, soft  tissue density projects in ring and little finger web space may represent retained foreign body or debris. IMPRESSION: 1. No acute osseous abnormality. 2. Possible retained soft tissue foreign body or debris projected in ring-little finger  web space. Report Dictated on Workstation: [id number] ---** Final ---** Dictating Physician: Georgetta Haber, MD, WENDELL Signed Date and Time: 04/02/2021 11:50 pm Signed by: Georgetta Haber, MD, [facility] Transcribed Date and Time: 04/02/2021 11:52      LABS:  Labs Reviewed - No data to display      EMERGENCY DEPARTMENT COURSE and DIFFERENTIAL DIAGNOSIS/MDM:   Vitals:    Vitals:    04/02/21 2137 04/02/21 2142   BP: 121/83 121/83   Pulse: 92 92   Resp:  16   Temp: 98.8 ??F (37.1 ??C) 98.8 ??F (37.1 ??C)   TempSrc: Oral Temporal       Medications   lidocaine PF 1 % injection 20 mL (has no administration in time range)   tetanus-diphth-acell pertussis (BOOSTRIX) injection 0.5 mL (has no administration in time range)   bacitracin ointment (has no administration in time range)   cephALEXin (KEFLEX) capsule 500 mg (has no administration in time range)       MDM.  Nursing Notes were reviewed.  CC reviewed, please see HPI for patients CC for my interview  Previous Medical charts and nurses note have been reviewed.   Available Labs in the ED were reviewed  Please see images performed above and radiologist interpretation.   Please see medications given in the ED above.   Based on all information given at this time, x-ray hand shows no osseous abnormality, possible retained soft tissue foreign body or debris projected in the ring and little finger webspace.  On examination during the procedure was not able to find any foreign body or hard object with direct visualization.  Patient states that she thinks that the foreign body was on top of her skin she fell on a hard object under her bandage that she picked off.  I will encourage the patient to follow-up with orthopedic hand and will give antibiotics just in case.   Patient has no neurological vascular deficits.  Patient is other abrasions on her hand which we covered bacitracin she will be discharged stable condition with stable signs information to follow-up with home doctor within next 5 to 7 days of sutures removed.  I instructed the patient to keep the hand covered not to use his hand to allow proper healing and prevent 0.3 of sutures.  Patient is agreeable this plan.  Differentials include:  -Fracture, laceration, foreign body  At this time, the information and evidence obtained from the HPI and physical examination does not warrant any other testing including laboratory or imaging.    Please see below for medications given at discharge.   Please see below for patient follow up appointment.   Patient was instructed to take medication as prescribed.   Patient was also instructed to return to the emergency department if symptoms worsen, or new symptoms develop and these symptoms were discussed with the patient such as worsening pain, develop redness, signs of infection, numbness to name a few.      Comment: Please note this report has been produced using speech recognition software and may contain errors related to that system including errors in grammar, punctuation, and spelling, as well as words and phrases that may be inappropriate.  If there are any questions or concerns please feel free to contact the dictating provider for clarification.    REVAL:   Reevaluation the patient upon Discharge showed no new physical exam finding, symptomatic complaints, stable vital signs.         CRITICAL CARE TIME   Total Critical Care  time was 0 minutes, excluding separately reportable procedures.  There was a high probability ofclinically significant/life threatening deterioration in the patient's condition which required my urgent intervention.     CONSULTS:  None    PROCEDURES:  Unless otherwise noted below, none     Lac Repair    Date/Time: 04/03/2021 1:18 AM  Performed by: Donald ProseJohn M  Naevia Unterreiner, PA  Authorized by: Donald ProseJohn M Murna Backer, PA     Consent:     Consent obtained:  Verbal    Consent given by:  Patient    Risks discussed:  Infection, pain, retained foreign body, tendon damage, poor cosmetic result, need for additional repair, nerve damage, poor wound healing and vascular damage    Alternatives discussed:  No treatment  Anesthesia (see MAR for exact dosages):     Anesthesia method:  Local infiltration    Local anesthetic:  Lidocaine 1% w/o epi  Repair type:     Repair type:  Simple  Pre-procedure details:     Preparation:  Patient was prepped and draped in usual sterile fashion  Exploration:     Hemostasis achieved with:  Direct pressure    Wound exploration: wound explored through full range of motion and entire depth of wound probed and visualized      Contaminated: no    Treatment:     Area cleansed with:  Betadine and soap and water    Amount of cleaning:  Standard    Irrigation method:  Syringe    Visualized foreign bodies/material removed: no    Skin repair:     Repair method:  Sutures    Suture size:  5-0    Suture material:  Nylon    Suture technique:  Simple interrupted    Number of sutures:  5  Approximation:     Approximation:  Close  Post-procedure details:     Dressing:  Antibiotic ointment    Patient tolerance of procedure:  Tolerated well, no immediate complications        FINAL IMPRESSION      1. Laceration of left hand with foreign body, initial encounter          DISPOSITION/PLAN   DISPOSITION Decision To Discharge 04/03/2021 01:19:26 AM      PATIENT REFERRED TO:  Thedore Minserek J Klaus, MD  [address]  [address]  [place] [id number]  [phone]    Schedule an appointment as soon as possible for a visit in 1 week      [facility] [place] [facility]  [address]  [place] [id number]  [phone]  Go to   If symptoms worsen    [facility]  [address]  [place] [id number]  [phone]  Schedule an appointment as soon as possible for a visit in 1  week        DISCHARGE MEDICATIONS:  New Prescriptions    ACETAMINOPHEN (TYLENOL) 500 MG TABLET    Take 2 tablets by mouth 3 times daily as needed for Pain    BACITRACIN 500 UNIT/GM OINTMENT    Apply topically 2 times daily.    CEPHALEXIN (KEFLEX) 500 MG CAPSULE    Take 1 capsule by mouth 2 times daily for 5 days          (Please note:  Portions of this note were completed with a voice recognition program.  Efforts were made to edit thedictations but occasionally words and phrases are mis-transcribed.)  Form v2016.J.[id number]  Donald Prose, PA (electronically signed)  Emergency Medicine Provider     Donald Prose, [place]  04/03/21 [phone]

## 2021-04-02 NOTE — ED Triage Notes (Signed)
Patient presents to the ED with c/o laceration to the right hand. Patient had contact with a broken window, to which she gained a laceration to the right ring finger knuckle, down to between the 2 fingers. Area also is present on the pinky knuckle. Bleeding is controlled at this time, and patient's hand wrapped lightly in triage.

## 2021-04-03 ENCOUNTER — Inpatient Hospital Stay: Admit: 2021-04-03 | Discharge: 2021-04-03 | Disposition: A | Discharge status: Home / Self Care

## 2021-04-03 MED ORDER — TETANUS-DIPHTH-ACELL PERTUSSIS 5-2.5-18.5 LF-MCG/0.5 IM SUSY
Freq: Once | INTRAMUSCULAR | Status: DC
Start: 2021-04-03 — End: 2021-04-03

## 2021-04-03 MED ORDER — CEPHALEXIN 500 MG PO CAPS
500 MG | ORAL_CAPSULE | Freq: Two times a day (BID) | ORAL | 0 refills | Status: AC
Start: 2021-04-03 — End: 2021-04-08

## 2021-04-03 MED ORDER — LIDOCAINE HCL 1 % IJ SOLN
1 % | Freq: Once | INTRAMUSCULAR | Status: DC
Start: 2021-04-03 — End: 2021-04-02

## 2021-04-03 MED ORDER — CEPHALEXIN 500 MG PO CAPS
500 MG | Freq: Once | ORAL | Status: DC
Start: 2021-04-03 — End: 2021-04-03

## 2021-04-03 MED ORDER — ACETAMINOPHEN 500 MG PO TABS
500 MG | ORAL_TABLET | Freq: Three times a day (TID) | ORAL | 0 refills | Status: AC | PRN
Start: 2021-04-03 — End: 2021-04-10

## 2021-04-03 MED ORDER — BACITRACIN 500 UNIT/GM EX OINT
500 UNIT/GM | CUTANEOUS | 0 refills | Status: AC
Start: 2021-04-03 — End: ?

## 2021-04-03 MED ORDER — BACITRACIN 500 UNIT/GM EX OINT
500 UNIT/GM | Freq: Once | CUTANEOUS | Status: DC
Start: 2021-04-03 — End: 2021-04-03

## 2021-04-03 MED ORDER — LIDOCAINE HCL (PF) 1 % IJ SOLN
1 % | Freq: Once | INTRAMUSCULAR | Status: DC
Start: 2021-04-03 — End: 2021-04-03

## 2021-04-03 MED FILL — XYLOCAINE-MPF 1 % IJ SOLN: 1 % | INTRAMUSCULAR | Qty: 20

## 2021-04-03 MED FILL — BACITRACIN 500 UNIT/GM EX OINT: 500 UNIT/GM | CUTANEOUS | Qty: 14

## 2021-04-03 MED FILL — CEPHALEXIN 500 MG PO CAPS: 500 mg | ORAL | Qty: 1

## 2021-04-03 MED FILL — BOOSTRIX 5-2.5-18.5 LF-MCG/0.5 IM SUSY: 5-2.5-18.5 LF-MCG/0.5 | INTRAMUSCULAR | Qty: 0.5

## 2021-04-03 NOTE — Discharge Instructions (Signed)
Please take medication as prescribed  Please follow up with your Physicians as instructed in this discharge paperwork    Thank you for choosing [facility]  I appreciate your patience  Please return to the emergency department if your symptoms worsen, or new symptoms develop as discussed

## 2021-04-03 NOTE — Other (Unsigned)
Patient Acct Nbr: [id number]   Primary AUTH/CERT:   Primary Insurance Company Name: [employer] Plan name: [facility] HMO [facility]  Primary Insurance Group Number: [id number]  Primary Insurance Plan Type: Health  Primary Insurance Policy Number: [id number]

## 2021-04-09 ENCOUNTER — Inpatient Hospital Stay: Admit: 2021-04-09 | Discharge: 2021-04-09 | Disposition: A | Discharge status: Home / Self Care

## 2021-04-09 DIAGNOSIS — Z5189 Encounter for other specified aftercare: Secondary | ICD-10-CM

## 2021-04-09 NOTE — ED Provider Notes (Signed)
Emergency DepartmentEncounter  [facility]    Patient: Jamie Ray  GMW:[id number]  DOB: Aug 01, 1996  Date of Evaluation: 04/09/2021  ED APP Provider: Claudette Head, PA    Chief Complaint       Chief Complaint   Patient presents with   ??? Suture / Staple Removal     HOPI     Jamie Ray is a 25 y.o. female who presents to the emergency department with concern for suture removal, patient states that her right hand that she cut with glass on Monday.  Patient states that she is here for suture removal, states that she has been antibiotic ointment on it and wrapping it, is concerned that it is not ready for the sutures to come out.  Patient denies any redness, nausea, vomiting, lightheadedness, dizziness, chest pain, shortness of breath.    ROS:     Review of Systems  14 systems reviewed and otherwise acutely negative except as in the HOPI.    Past History   History reviewed. No pertinent past medical history.  History reviewed. No pertinent surgical history.  Social History     Socioeconomic History   ??? Marital status: Single     Spouse name: None   ??? Number of children: None   ??? Years of education: None   ??? Highest education level: None   Occupational History   ??? None   Tobacco Use   ??? Smoking status: Never Smoker   ??? Smokeless tobacco: Never Used   Vaping Use   ??? Vaping Use: Never used   Substance and Sexual Activity   ??? Alcohol use: Never   ??? Drug use: Never   ??? Sexual activity: None   Other Topics Concern   ??? None   Social History Narrative   ??? None     Social Determinants of Health     Financial Resource Strain:    ??? Difficulty of Paying Living Expenses: Not on file   Food Insecurity:    ??? Worried About Programme researcher, broadcasting/film/video in the Last Year: Not on file   ??? Ran Out of Food in the Last Year: Not on file   Transportation Needs:    ??? Lack of Transportation (Medical): Not on file   ??? Lack of Transportation (Non-Medical): Not on file   Physical Activity:    ??? Days of Exercise per Week: Not on file   ??? Minutes of  Exercise per Session: Not on file   Stress:    ??? Feeling of Stress : Not on file   Social Connections:    ??? Frequency of Communication with Friends and Family: Not on file   ??? Frequency of Social Gatherings with Friends and Family: Not on file   ??? Attends Religious Services: Not on file   ??? Active Member of Clubs or Organizations: Not on file   ??? Attends Banker Meetings: Not on file   ??? Marital Status: Not on file   Intimate Partner Violence:    ??? Fear of Current or Ex-Partner: Not on file   ??? Emotionally Abused: Not on file   ??? Physically Abused: Not on file   ??? Sexually Abused: Not on file   Housing Stability:    ??? Unable to Pay for Housing in the Last Year: Not on file   ??? Number of Places Lived in the Last Year: Not on file   ??? Unstable Housing in the Last Year: Not on file  Medications/Allergies     Previous Medications    ACETAMINOPHEN (TYLENOL) 500 MG TABLET    Take 2 tablets by mouth 3 times daily as needed for Pain    BACITRACIN 500 UNIT/GM OINTMENT    Apply topically 2 times daily.    BUTALBITAL-ACETAMINOPHEN-CAFFEINE (FIORICET, ESGIC) 50-325-40 MG PER TABLET    Take 1 tablet by mouth every 4 hours as needed for Headaches    ONDANSETRON (ZOFRAN ODT) 4 MG DISINTEGRATING TABLET    Take 1 tablet by mouth every 8 hours as needed for Nausea or Vomiting     No Known Allergies     Physical Exam       ED Triage Vitals [04/09/21 1130]   BP Temp Temp Source Heart Rate Resp SpO2 Height Weight   120/88 96.9 ??F (36.1 ??C) Temporal 64 18 100 % -- --     Physical Exam  Constitutional:       Appearance: Normal appearance.   HENT:      Head: Normocephalic and atraumatic.      Mouth/Throat:      Mouth: Mucous membranes are moist.   Eyes:      Extraocular Movements: Extraocular movements intact.      Pupils: Pupils are equal, round, and reactive to light.   Cardiovascular:      Rate and Rhythm: Normal rate and regular rhythm.   Pulmonary:      Effort: Pulmonary effort is normal. No respiratory distress.       Breath sounds: No stridor. No wheezing, rhonchi or rales.   Abdominal:      General: There is no distension.      Palpations: Abdomen is soft. There is no mass.      Tenderness: There is no abdominal tenderness. There is no guarding.      Hernia: No hernia is present.   Musculoskeletal:         General: Normal range of motion.      Right hand: Laceration present. No tenderness. Normal range of motion.        Hands:    Skin:     General: Skin is warm and dry.      Capillary Refill: Capillary refill takes less than 2 seconds.   Neurological:      General: No focal deficit present.      Mental Status: She is alert and oriented to person, place, and time.      Sensory: No sensory deficit.      Motor: No weakness.           SCREENINGS    Glasgow Coma Scale  Eye Opening: Spontaneous  Best Verbal Response: Oriented  Best Motor Response: Obeys commands  Glasgow Coma Scale Score: 15      Diagnostics   Labs:  No results found for this visit on 04/09/21.  Radiographs:  No results found.    Procedures:     ED Course and MDM    Glasgow Coma Scale  Eye Opening: Spontaneous  Best Verbal Response: Oriented  Best Motor Response: Obeys commands  Glasgow Coma Scale Score: 15      In brief, Jamie Ray is a 25 y.o. female who presented to the emergency department with concern for wound.  Vital signs are normal, patient has sutures placed on her right hand.  There is evidence of skin maceration between the webbing's of the hand.  Believe that patient can wait a couple more days for this to dry out, and had  sutures removed next few days, patient agrees with this plan.  Patient will be discharged at this time.    Scherrie Gerlach and myself have engaged in Shared Decision Making to ensure adequate and appropriate information was provided to Scherrie Gerlach to assist them in choosing a course of treatment based on their own preferences and concerns.      Final Impression      1. Visit for wound check          DISPOSITION  Disposition:  Discharge to home  Patient condition is good      Patient seen independently with an Emergency Medicine attending available for supervision.    (Please note that portions of this note may have been completed with a voice recognition program. Efforts were made to edit the dictations but occasionally words aremis-transcribed.)    Claudette Head, PA  [employer]        [place], [place]  04/09/21 1141

## 2021-04-09 NOTE — ED Notes (Signed)
Pt. Coming in for stiches to be removed from R. Hand.      Milinda Antis, RN  04/09/21 1130

## 2021-04-09 NOTE — Other (Unsigned)
Patient Acct Nbr: [id number]   Primary AUTH/CERT:   Primary Insurance Company Name: [employer] Plan name: [facility] HMO [facility]  Primary Insurance Group Number: [id number]  Primary Insurance Plan Type: Health  Primary Insurance Policy Number: [id number]

## 2021-04-09 NOTE — Discharge Instructions (Signed)
Keep the area clean, dry and allow it to air out over the next couple of days, return on Tuesday or Wednesday for suture removal.

## 2021-04-13 ENCOUNTER — Inpatient Hospital Stay: Admit: 2021-04-13 | Discharge: 2021-04-13 | Disposition: A | Discharge status: Home / Self Care

## 2021-04-13 DIAGNOSIS — Z5189 Encounter for other specified aftercare: Secondary | ICD-10-CM

## 2021-04-13 NOTE — Other (Unsigned)
Patient Acct Nbr: [id number]   Primary AUTH/CERT:   Primary Insurance Company Name: [employer] Plan name: [facility] HMO [facility]  Primary Insurance Group Number: [id number]  Primary Insurance Plan Type: Health  Primary Insurance Policy Number: [id number]

## 2021-04-13 NOTE — ED Triage Notes (Signed)
Presents for suture removal to hand

## 2021-04-13 NOTE — ED Provider Notes (Signed)
[facility] ED  eMERGENCY dEPARTMENT eNCOUnter      Pt Name: Jamie Ray  MRN: [id number]  Birthdate 02/22/1996  Date of evaluation: 04/13/2021  Provider: Nancy Nordmann, APRN - CNP   I have evaluated this patient on my own, per my scope of practice with an attending physician available for consultation.    CHIEF COMPLAINT       Chief Complaint   Patient presents with   ??? Suture / Staple Removal         HISTORY OF PRESENT ILLNESS   (Location/Symptom, Timing/Onset,Context/Setting, Quality, Duration, Modifying Factors, Severity) Note limiting factors.   HPI    Jamie Ray is a 25 y.o. female who presents to the emergency department for a wound check, patient has stitches to the fourth MCP joint and extending into the webspace between the fourth and fifth fingers.  She states they were placed a week ago.  Wants to get her stitches removed.  Nursing Notes were reviewed.    REVIEW OF SYSTEMS    (2+ for4; 10+ for level 5)     Review of Systems   Constitutional: Negative for activity change, appetite change, chills and fever.   HENT: Negative for congestion, ear discharge, ear pain, hearing loss, postnasal drip, rhinorrhea and sore throat.    Eyes: Negative for discharge and redness.   Respiratory: Negative for chest tightness and shortness of breath.    Cardiovascular: Negative for chest pain.   Gastrointestinal: Negative for abdominal pain, diarrhea, nausea and vomiting.   Genitourinary: Negative for dysuria.   Musculoskeletal: Negative for arthralgias and myalgias.   Skin: Positive for wound. Negative for color change.   Neurological: Negative for dizziness, light-headedness and headaches.   Psychiatric/Behavioral: Negative for confusion.   All other systems reviewed and are negative.      PAST MEDICAL HISTORY   No past medical history on file.    SURGICALHISTORY     No past surgical history on file.    CURRENT MEDICATIONS       Previous Medications    ACETAMINOPHEN (TYLENOL) 500 MG TABLET    Take 2 tablets by  mouth 3 times daily as needed for Pain    BACITRACIN 500 UNIT/GM OINTMENT    Apply topically 2 times daily.    BUTALBITAL-ACETAMINOPHEN-CAFFEINE (FIORICET, ESGIC) 50-325-40 MG PER TABLET    Take 1 tablet by mouth every 4 hours as needed for Headaches    ONDANSETRON (ZOFRAN ODT) 4 MG DISINTEGRATING TABLET    Take 1 tablet by mouth every 8 hours as needed for Nausea or Vomiting            Patient has no known allergies.    FAMILY HISTORY     No family history on file.       SOCIAL HISTORY       Social History     Socioeconomic History   ??? Marital status: Single     Spouse name: Not on file   ??? Number of children: Not on file   ??? Years of education: Not on file   ??? Highest education level: Not on file   Occupational History   ??? Not on file   Tobacco Use   ??? Smoking status: Never Smoker   ??? Smokeless tobacco: Never Used   Vaping Use   ??? Vaping Use: Never used   Substance and Sexual Activity   ??? Alcohol use: Never   ??? Drug use: Never   ??? Sexual activity: Not  on file   Other Topics Concern   ??? Not on file   Social History Narrative   ??? Not on file     Social Determinants of Health     Financial Resource Strain:    ??? Difficulty of Paying Living Expenses: Not on file   Food Insecurity:    ??? Worried About Running Out of Food in the Last Year: Not on file   ??? Ran Out of Food in the Last Year: Not on file   Transportation Needs:    ??? Lack of Transportation (Medical): Not on file   ??? Lack of Transportation (Non-Medical): Not on file   Physical Activity:    ??? Days of Exercise per Week: Not on file   ??? Minutes of Exercise per Session: Not on file   Stress:    ??? Feeling of Stress : Not on file   Social Connections:    ??? Frequency of Communication with Friends and Family: Not on file   ??? Frequency of Social Gatherings with Friends and Family: Not on file   ??? Attends Religious Services: Not on file   ??? Active Member of Clubs or Organizations: Not on file   ??? Attends Banker Meetings: Not on file   ??? Marital Status: Not  on file   Intimate Partner Violence:    ??? Fear of Current or Ex-Partner: Not on file   ??? Emotionally Abused: Not on file   ??? Physically Abused: Not on file   ??? Sexually Abused: Not on file   Housing Stability:    ??? Unable to Pay for Housing in the Last Year: Not on file   ??? Number of Places Lived in the Last Year: Not on file   ??? Unstable Housing in the Last Year: Not on file       SCREENINGS    Glasgow Coma Scale  Eye Opening: Spontaneous  Best Verbal Response: Oriented  Best Motor Response: Obeys commands  Glasgow Coma Scale Score: 15      PHYSICAL EXAM    (5+ for level 4, 8+ for level 5)     ED Triage Vitals [04/13/21 1126]   BP Temp Temp Source Heart Rate Resp SpO2 Height Weight   100/69 97.1 ??F (36.2 ??C) Temporal 68 16 100 % -- --       Physical Exam  Vitals and nursing note reviewed.   Constitutional:       General: She is not in acute distress.     Appearance: Normal appearance. She is normal weight.   HENT:      Head: Normocephalic and atraumatic.      Right Ear: External ear normal.      Left Ear: External ear normal.   Eyes:      Conjunctiva/sclera: Conjunctivae normal.      Pupils: Pupils are equal, round, and reactive to light.   Musculoskeletal:      Cervical back: Normal range of motion and neck supple. No rigidity or tenderness.      Comments: Healed wound over the fourth and fifth MCP joint extending down in the webspace between the fourth and fifth fingers.   Lymphadenopathy:      Cervical: No cervical adenopathy.   Skin:     General: Skin is warm and dry.      Capillary Refill: Capillary refill takes less than 2 seconds.      Coloration: Skin is not jaundiced or pale.  Findings: No bruising or erythema.   Neurological:      General: No focal deficit present.      Mental Status: She is alert and oriented to person, place, and time. Mental status is at baseline.   Psychiatric:         Mood and Affect: Mood normal.         DIAGNOSTIC RESULTS     EKG (Per Emergency Physician):   Not performed.      RADIOLOGY (Per EmergencyPhysician):   Not performed.     Interpretation per the Radiologist below, if available at the time of this note:  No results found.    :  Labs Reviewed - No data to display    All other labs were within normal range or not returned as of this dictation.    EMERGENCY DEPARTMENT COURSE and DIFFERENTIALDIAGNOSIS/MDM:   Vitals:    Vitals:    04/13/21 1126   BP: 100/69   Pulse: 68   Resp: 16   Temp: 97.1 ??F (36.2 ??C)   TempSrc: Temporal   SpO2: 100%       Medications - No data to display    MDM.  Patient presents for suture removal, I removed 2 of the stitches she still has 4 and there she wants to wait for it to heal longer I told her it is healed we can take the stitches out, it appears that she has not clean the wound once in the last week there is a lot of scabbing the stitches had to be released from the scabs she states she will come back in another week to get them out.  I advised her to make sure she gets the stitches out she was discharged.    I have evaluated this patient on my own, per my scope of practice with an attending physician available for consultation.    CONSULTS:  None    PROCEDURES:  Unless otherwise noted below, none     Procedures    Patients symptoms are consistent with sepsis, severe sepsis, or septic shock (If yes use ".sepsiscoremeasure"): no    FINAL IMPRESSION      1. Encounter for wound re-check    2. Visit for suture removal        DISPOSITION/PLAN   DISPOSITION Decision To Discharge 04/13/2021 11:37:03 AM      PATIENT REFERRED TO:  [facility] ED  [address] [place] [address]  [phone]  In 5 days  For suture removal      DISCHARGE MEDICATIONS:  New Prescriptions    No medications on file          (Please note:  Portions of this note were completed with a voice recognition program.  Efforts were made to edit thedictations but occasionally words and phrases are mis-transcribed.)    Form v2016.J.5-cn    Nancy Nordmann, APRN - CNP (electronically  signed)  Emergency Medicine Provider           Nancy Nordmann, APRN - CNP  04/13/21 1139

## 2021-04-25 ENCOUNTER — Inpatient Hospital Stay
Admit: 2021-04-25 | Discharge: 2021-04-25 | Disposition: A | Discharge status: Home / Self Care | Attending: Emergency Medicine

## 2021-04-25 DIAGNOSIS — Z4802 Encounter for removal of sutures: Secondary | ICD-10-CM

## 2021-04-25 NOTE — Other (Unsigned)
Patient Acct Nbr: [id number]   Primary AUTH/CERT:   Primary Insurance Company Name: [employer] Plan name: [facility] HMO [facility]  Primary Insurance Group Number: [id number]  Primary Insurance Plan Type: Health  Primary Insurance Policy Number: [id number]

## 2021-04-25 NOTE — ED Triage Notes (Signed)
Pt presents with sutures to have removed on right hand.

## 2021-04-25 NOTE — ED Notes (Signed)
Pt arrived in ED during this time. Pt liaison introduced self and role/availability providing hospitality/supportive presence.      Jamie Ray Mitch Arquette  04/25/21 1211

## 2021-04-25 NOTE — ED Provider Notes (Signed)
[facility] ED  EMERGENCY DEPARTMENT ENCOUNTER      Pt Name: Jamie Ray  MRN: [id number]  Birthdate 06-Mar-1996  Date of evaluation: 04/25/2021  Provider: Vedia Coffer, DO     CHIEF COMPLAINT       Chief Complaint   Patient presents with    Suture / Staple Removal         HISTORY OF PRESENT ILLNESS   (Location/Symptom, Timing/Onset, Context/Setting, Quality, Duration, Modifying Factors, Severity) Note limiting factors.       HPI    Jamie Ray is a 25 y.o. female who presents to the emergency department for suture removal.  The patient reports that she had sutures placed for a right hand laceration about 2 weeks ago.  The patient was instructed to return back after 7 to 10 days for suture removal, however she reports that she was unable to do so.  The patient denies any new lacerations or injuries to the right hand.  He admits to some sloughing of the skin around the sutures.  Denies other review of systems as below.    Nursing Notes were reviewed.    REVIEW OF SYSTEMS    (2+ for level 4; 10+ for level 5)   Review of Systems  CONSTITUTIONAL - denies fevers, chills  HEENT -denies congestion  CARDIOVASCULAR -denies chest pain  RESPIRATORY -denies shortness of breath  GI -denies abdominal pain  GU -denies dysuria  SKIN -denies rash  MSK -denies back pain  NEURO -denies headaches  PSYCH -denies mood swings    PAST MEDICAL HISTORY   History reviewed. No pertinent past medical history.    SURGICAL HISTORY     History reviewed. No pertinent surgical history.    CURRENT MEDICATIONS       Previous Medications    ACETAMINOPHEN (TYLENOL) 500 MG TABLET    Take 2 tablets by mouth 3 times daily as needed for Pain    BACITRACIN 500 UNIT/GM OINTMENT    Apply topically 2 times daily.    BUTALBITAL-ACETAMINOPHEN-CAFFEINE (FIORICET, ESGIC) 50-325-40 MG PER TABLET    Take 1 tablet by mouth every 4 hours as needed for Headaches    ONDANSETRON (ZOFRAN ODT) 4 MG DISINTEGRATING TABLET    Take 1 tablet by mouth every 8 hours as  needed for Nausea or Vomiting       ALLERGIES     Patient has no known allergies.    FAMILY HISTORY     No family history on file.     SOCIAL HISTORY       Social History     Socioeconomic History    Marital status: Single     Spouse name: None    Number of children: None    Years of education: None    Highest education level: None   Tobacco Use    Smoking status: Never    Smokeless tobacco: Never   Vaping Use    Vaping Use: Never used   Substance and Sexual Activity    Alcohol use: Never    Drug use: Never       SCREENINGS    Glasgow Coma Scale  Eye Opening: Spontaneous  Best Verbal Response: Oriented  Best Motor Response: Obeys commands  Glasgow Coma Scale Score: 15      PHYSICAL EXAM    (up to 7 for level 4, 8 or more for level 5)     ED Triage Vitals   BP Temp Temp src Pulse Resp  SpO2 Height Weight   -- -- -- -- -- -- -- --       Physical Exam  GENERAL -well appearing, no acute distress  HEAD - atraumatic  EYES - EOMI, PERRL  RESPIRATORY - no acute distress  SKIN - dry, no acute rash, sloughed skin over the dorsum of the right hand at the distal fourth and fifth metacarpals.  3 sutures are present.  Wound is healed.  NEURO - alert and oriented x4, moving extremities x4    DIAGNOSTIC RESULTS     Interpretation per the Radiologist below, if available at the time of this note:  No results found.    ED BEDSIDE ULTRASOUND:   Performed by ED Physician - none    LABS:  Labs Reviewed - No data to display     All other labs were within normal range or not returned as of this dictation.    EMERGENCY DEPARTMENT COURSE and DIFFERENTIAL DIAGNOSIS/MDM:   Vitals:    Vitals:    04/25/21 1223   BP: 133/79   Pulse: 71   Resp: 16   Temp: 98.1 ??F (36.7 ??C)   TempSrc: Temporal   SpO2: 90%       Medications - No data to display    MDM  .  I, Dr. Wandra Feinstein, am the primary clinician of record.  I personally saw the patient and performed a substantive portion of the visit including all aspects of the medical decision making.    Patient is  a 25 y.o. female presenting to the emergency department for right hand suture removal.The chart was reviewed for pertinent history relating to the chief complaint.  The patient was evaluated at bedside.  The patient appears well, vitals are stable, right hand suture removal, no signs of infection, wound is healed.  Sutures were removed.        PROCEDURES:  Unless otherwise noted below, none     Suture Removal    Date/Time: 04/25/2021 12:32 PM  Performed by: Vedia Coffer, DO  Authorized by: Vedia Coffer, DO     Consent:     Consent obtained:  Verbal    Consent given by:  Patient    Risks, benefits, and alternatives were discussed: yes      Risks discussed:  Wound separation, pain and bleeding  Location:     Location:  Upper extremity    Upper extremity location:  Hand  Procedure details:     Wound appearance:  No signs of infection, clean and pink  Post-procedure details:     Procedure completion:  Tolerated well, no immediate complications    FINAL IMPRESSION      1. Visit for suture removal          DISPOSITION/PLAN   DISPOSITION Decision To Discharge 04/25/2021 01:00:37 PM      PATIENT REFERRED TO:  [facility] ED  [address] [place] [address]  [phone]    If symptoms worsen    DISCHARGE MEDICATIONS:  New Prescriptions    No medications on file          (Please note:  Portions of this note were completed with a voice recognition program.  Efforts were made to edit the dictations but occasionally words and phrases are mis-transcribed.)  Form v2016.J.5-cn    Vedia Coffer, DO (electronically signed)  Emergency Medicine Provider        Vedia Coffer, DO  04/25/21 1325

## 2023-10-05 ENCOUNTER — Ambulatory Visit: Payer: Medicaid Other | Admitting: Physician Assistant

## 2023-10-05 NOTE — Progress Notes (Deleted)
 New patient visit   Patient: Maria Simpson   DOB: 25-Aug-1996   28 y.o. Female  MRN: [id number] Visit Date: 10/05/2023  Today's healthcare provider: Manuelita Flatness, PA-C   No chief complaint on file.  Subjective    Maria Simpson is a 28 y.o. female who presents today as a new patient to establish care.   Pap?    Past Medical History:  Diagnosis Date   Medical history non-contributory    Past Surgical History:  Procedure Laterality Date   NO PAST SURGERIES     Family Status  Relation Name Status   Unknown  (Not Specified)   MGM  (Not Specified)   MGF  (Not Specified)   Neg Hx  (Not Specified)  No partnership data on file   Family History  Problem Relation Age of Onset   Prostate cancer Unknown    Diabetes Maternal Grandmother    Diabetes Maternal Grandfather    Heart disease Maternal Grandfather    Hypertension Maternal Grandfather    Cancer Neg Hx    Social History   Socioeconomic History   Marital status: Single    Spouse name: Not on file   Number of children: Not on file   Years of education: Not on file   Highest education level: Not on file  Occupational History   Not on file  Tobacco Use   Smoking status: Never   Smokeless tobacco: Never  Substance and Sexual Activity   Alcohol use: No   Drug use: No   Sexual activity: Yes    Birth control/protection: None  Other Topics Concern   Not on file  Social History Narrative   Exercise--- softball   Social Drivers of Health   Financial Resource Strain: Not on file  Food Insecurity: Not on file  Transportation Needs: Not on file  Physical Activity: Not on file  Stress: Not on file  Social Connections: Not on file   Outpatient Medications Prior to Visit  Medication Sig   ibuprofen  (ADVIL ,MOTRIN ) 800 MG tablet Take 1 tablet (800 mg total) by mouth every 8 (eight) hours as needed. (Patient not taking: Reported on 10/25/2017)   Prenatal Vit-Fe Fumarate-FA (PRENATAL MULTIVITAMIN) TABS tablet  Take 1 tablet by mouth daily.    No facility-administered medications prior to visit.   No Known Allergies  Immunization History  Administered Date(s) Administered   DTaP 09/11/1996, 11/27/1996, 02/19/1997, 10/09/1997, 01/22/2002   HIB (PRP-OMP) 09/11/1996, 11/27/1996, 02/19/1997, 10/09/1997   HPV Bivalent 03/17/2015   HPV Quadrivalent 02/18/2014   Hepatitis A 09/08/2006, 05/16/2008   Hepatitis B 31-Jul-1996, 08/20/1996, 03/27/1997   IPV 09/11/1996, 11/27/1996, 10/09/1997, 01/22/2002   Influenza-Unspecified 11/04/2015   MMR 10/09/1997, 01/22/2002   Meningococcal Conjugate 03/17/2015   Meningococcal polysaccharide vaccine (MPSV4) 05/16/2008   PPD Test 03/17/2015   Tdap 05/06/2008, 11/04/2015, 07/24/2017    Health Maintenance  Topic Date Due   Hepatitis C Screening  Never done   HPV VACCINES (3 - 3-dose series) 06/09/2015   Cervical Cancer Screening (Pap smear)  10/25/2020   INFLUENZA VACCINE  05/03/2023   COVID-19 Vaccine (1 - 2024-25 season) Never done   DTaP/Tdap/Td (9 - Td or Tdap) 07/25/2027   HIV Screening  Completed    Patient Care Team: Estelle Service, MD as Consulting Physician (Obstetrics and Gynecology)  Review of Systems  {Insert previous labs (optional):23779} {See past labs  Heme  Chem  Endocrine  Serology  Results Review (optional):1}   Objective    There were  no vitals taken for this visit. {Insert last BP/Wt (optional):23777}{See vitals history (optional):1}   Physical Exam ***  Depression Screen     No data to display         No results found for any visits on 10/05/23.  Assessment & Plan     There are no diagnoses linked to this encounter.   No follow-ups on file.      Manuelita Flatness, PA-C  [facility] Primary Care at [facility] [phone] (phone) [phone] (fax)  [facility]

## 2023-10-10 ENCOUNTER — Ambulatory Visit: Payer: Self-pay | Admitting: Family Medicine

## 2023-10-31 NOTE — Progress Notes (Deleted)
 New patient visit   Patient: Maria Simpson   DOB: 1992/12/15   28 y.o. Female  MRN: [id number] Visit Date: 11/01/2023  Today's healthcare provider: Alfredia Ferguson, PA-C   No chief complaint on file.  Subjective    Maria Simpson is a 28 y.o. female who presents today as a new patient to establish care.   ***  No past medical history on file. *** The histories are not reviewed yet. Please review them in the "History" navigator section and refresh this SmartLink. No family status information on file.   No family history on file. Social History   Socioeconomic History   Marital status: Not on file    Spouse name: Not on file   Number of children: Not on file   Years of education: Not on file   Highest education level: Some college, no degree  Occupational History   Not on file  Tobacco Use   Smoking status: Not on file   Smokeless tobacco: Not on file  Substance and Sexual Activity   Alcohol use: Not on file   Drug use: Not on file   Sexual activity: Not on file  Other Topics Concern   Not on file  Social History Narrative   Not on file   Social Drivers of Health   Financial Resource Strain: Patient Declined (10/08/2023)   Overall Financial Resource Strain (CARDIA)    Difficulty of Paying Living Expenses: Patient declined  Food Insecurity: Patient Declined (10/08/2023)   Hunger Vital Sign    Worried About Running Out of Food in the Last Year: Patient declined    Ran Out of Food in the Last Year: Patient declined  Transportation Needs: No Transportation Needs (10/08/2023)   PRAPARE - Administrator, Civil Service (Medical): No    Lack of Transportation (Non-Medical): No  Physical Activity: Sufficiently Active (10/08/2023)   Exercise Vital Sign    Days of Exercise per Week: 4 days    Minutes of Exercise per Session: 40 min  Stress: Stress Concern Present (10/08/2023)   [employer] of Occupational Health - Occupational Stress Questionnaire    Feeling  of Stress : Very much  Social Connections: Socially Isolated (10/08/2023)   Social Connection and Isolation Panel [NHANES]    Frequency of Communication with Friends and Family: More than three times a week    Frequency of Social Gatherings with Friends and Family: Once a week    Attends Religious Services: Never    Database administrator or Organizations: No    Attends Engineer, structural: Not on file    Marital Status: Never married   No outpatient medications prior to visit.   No facility-administered medications prior to visit.   Not on File   There is no immunization history on file for this patient.  Health Maintenance  Topic Date Due   HIV Screening  Never done   Hepatitis C Screening  Never done   DTaP/Tdap/Td (1 - Tdap) Never done   INFLUENZA VACCINE  Never done   COVID-19 Vaccine (1 - 2024-25 season) Never done   Cervical Cancer Screening (HPV/Pap Cotest)  Never done   HPV VACCINES  Aged Out    No care team member to display  Review of Systems  {Insert previous labs (optional):23779} {See past labs  Heme  Chem  Endocrine  Serology  Results Review (optional):1}   Objective    There were no vitals taken for this visit. {Insert last BP/Wt (  optional):23777}{See vitals history (optional):1}   Physical Exam ***  Depression Screen     No data to display         No results found for any visits on 11/01/23.  Assessment & Plan     There are no diagnoses linked to this encounter.   No follow-ups on file.      Alfredia Ferguson, PA-C  [facility] Primary Care at [facility] [phone] (phone) [phone] (fax)  [facility]

## 2023-11-01 ENCOUNTER — Encounter: Payer: Self-pay | Admitting: Physician Assistant

## 2023-11-01 ENCOUNTER — Ambulatory Visit: Payer: Self-pay | Admitting: Physician Assistant

## 2023-11-05 ENCOUNTER — Ambulatory Visit: Payer: Self-pay | Admitting: Family Medicine

## 2023-11-05 ENCOUNTER — Encounter: Payer: Self-pay | Admitting: Family Medicine

## 2023-11-13 ENCOUNTER — Encounter: Payer: Self-pay | Admitting: Family Medicine

## 2023-11-13 ENCOUNTER — Ambulatory Visit (INDEPENDENT_AMBULATORY_CARE_PROVIDER_SITE_OTHER): Payer: Medicaid Other | Admitting: Family Medicine

## 2023-11-13 VITALS — BP 104/70 | HR 61 | Temp 98.5°F | Resp 18 | Ht 67.0 in | Wt 246.7 lb

## 2023-11-13 DIAGNOSIS — G8929 Other chronic pain: Secondary | ICD-10-CM

## 2023-11-13 DIAGNOSIS — Z7689 Persons encountering health services in other specified circumstances: Secondary | ICD-10-CM

## 2023-11-13 DIAGNOSIS — N6489 Other specified disorders of breast: Secondary | ICD-10-CM | POA: Insufficient documentation

## 2023-11-13 DIAGNOSIS — S46812A Strain of other muscles, fascia and tendons at shoulder and upper arm level, left arm, initial encounter: Secondary | ICD-10-CM | POA: Insufficient documentation

## 2023-11-13 DIAGNOSIS — N926 Irregular menstruation, unspecified: Secondary | ICD-10-CM

## 2023-11-13 DIAGNOSIS — N912 Amenorrhea, unspecified: Secondary | ICD-10-CM | POA: Insufficient documentation

## 2023-11-13 DIAGNOSIS — E669 Obesity, unspecified: Secondary | ICD-10-CM | POA: Diagnosis not present

## 2023-11-13 DIAGNOSIS — N62 Hypertrophy of breast: Secondary | ICD-10-CM

## 2023-11-13 DIAGNOSIS — M546 Pain in thoracic spine: Secondary | ICD-10-CM | POA: Diagnosis not present

## 2023-11-13 MED ORDER — NAPROXEN 500 MG PO TABS: 500.0000 mg | ORAL_TABLET | Freq: Two times a day (BID) | ORAL | 0 refills | Status: AC

## 2023-11-13 NOTE — Assessment & Plan Note (Signed)
Recommend food journal to assess number of calories eating per day. Continue to exercise. Get back on plan if deviate from healthy eating plan.

## 2023-11-13 NOTE — Assessment & Plan Note (Signed)
Past 2 months menstrual cycle has been irregular and shorter than normal.  Denies possibility of pregnancy. Will get pregnancy test to be sure. Under more stress than normal with recent move to [facility].  Will get labs today to rule out physical cause.

## 2023-11-13 NOTE — Assessment & Plan Note (Signed)
Tenderness to palpation to left trapezius muscle. Will check kidney function today. Naproxen 500 mg BID with food once kidney function is established.

## 2023-11-13 NOTE — Assessment & Plan Note (Signed)
Referral to plastic surgery for consultation for breast reduction surgery.

## 2023-11-13 NOTE — Progress Notes (Signed)
New Patient Office Visit  Subjective    Patient ID: Maria Simpson, female    DOB: [birth year]  Age: 28 y.o. MRN: [id number]  CC:  Chief Complaint  Patient presents with   Establish Care    Patient is here to establish care and discuss her menstrual cycle she states that it comes sometimes weeks late, and doesn't stay on as long as usual.    Weight Loss    Patient is here to discuss weight management she says she works out about 3-4 days a week. She states that she would also like a referral for breast reduction due to the fact it causes her back to hurt and left shoulder    HPI Maria Simpson presents to establish care with this practice. She is new to me.  Menstrual cycle: irregular cycle, comes 1-2 weeks late for past 2 months.  Home pregnancy test negative.  Not sexually active.  Had implanted birth control removed in Feb 2021.  Regular in 2022. Recently started being irregular.  Under stress currently. Moved from [place] in July. Recently moved to [facility] in January.   Breast reduction/ weight loss: Works out "like crazy" Has a good meal plan, nothing is working. Unsure of how many calories she is eating Eating once per day. Thinking about breast reduction. Back pain and left shoulder pain. Weight does not go down regardless of diet efforts.   Back pain/ trapezius soreness:  No known injury. ROM intact to left shoulder. No bony tenderness in left shoulder joint.  Attributes pain to the size of her breast.         Outpatient Encounter Medications as of 11/13/2023  Medication Sig   naproxen (NAPROSYN) 500 MG tablet Take 1 tablet (500 mg total) by mouth 2 (two) times daily with a meal.   [DISCONTINUED] Prenatal Vit-Fe Fumarate-FA (PRENATAL MULTIVITAMIN) TABS tablet Take 1 tablet by mouth daily.    No facility-administered encounter medications on file as of 11/13/2023.    Past Medical History:  Diagnosis Date   Medical history non-contributory     Past  Surgical History:  Procedure Laterality Date   NO PAST SURGERIES      Family History  Problem Relation Age of Onset   Prostate cancer Unknown    Diabetes Maternal Grandmother    Diabetes Maternal Grandfather    Heart disease Maternal Grandfather    Hypertension Maternal Grandfather    Cancer Neg Hx     Social History   Socioeconomic History   Marital status: Single    Spouse name: Not on file   Number of children: 2   Years of education: Not on file   Highest education level: Some college, no degree  Occupational History   Not on file  Tobacco Use   Smoking status: Never    Passive exposure: Never   Smokeless tobacco: Never  Substance and Sexual Activity   Alcohol use: Not Currently   Drug use: Never   Sexual activity: Yes    Birth control/protection: Condom, None  Other Topics Concern   Not on file  Social History Narrative          Exercise--- softball   Social Drivers of Health   Financial Resource Strain: Patient Declined (10/08/2023)   Overall Financial Resource Strain (CARDIA)    Difficulty of Paying Living Expenses: Patient declined  Food Insecurity: Patient Declined (10/08/2023)   Hunger Vital Sign    Worried About Running Out of Food in the Last  Year: Patient declined    Barista in the Last Year: Patient declined  Transportation Needs: No Transportation Needs (10/08/2023)   PRAPARE - Administrator, Civil Service (Medical): No    Lack of Transportation (Non-Medical): No  Physical Activity: Sufficiently Active (10/08/2023)   Exercise Vital Sign    Days of Exercise per Week: 4 days    Minutes of Exercise per Session: 40 min  Stress: Stress Concern Present (10/08/2023)   [employer] of Occupational Health - Occupational Stress Questionnaire    Feeling of Stress : Very much  Social Connections: Socially Isolated (10/08/2023)   Social Connection and Isolation Panel [NHANES]    Frequency of Communication with Friends and Family: More than  three times a week    Frequency of Social Gatherings with Friends and Family: Once a week    Attends Religious Services: Never    Database administrator or Organizations: No    Attends Engineer, structural: Not on file    Marital Status: Never married  Intimate Partner Violence: Not on file    Review of Systems  Constitutional:  Negative for malaise/fatigue.  Respiratory:  Negative for shortness of breath.   Cardiovascular:  Negative for chest pain.  Gastrointestinal:  Negative for abdominal pain, nausea and vomiting.  Musculoskeletal:  Positive for back pain.       Left trapezius muscle pain.   Endo/Heme/Allergies:  Negative for polydipsia.        Objective    BP 104/70   Pulse 61   Temp 98.5 F (36.9 C) (Oral)   Resp 18   Ht 5\' 7"  (1.702 m)   Wt 246 lb 11.2 oz (111.9 kg)   LMP 10/18/2023 (Exact Date)   SpO2 99%   BMI 38.64 kg/m   Physical Exam Vitals and nursing note reviewed.  Constitutional:      General: She is not in acute distress.    Appearance: Normal appearance. She is obese.  Cardiovascular:     Rate and Rhythm: Regular rhythm.     Heart sounds: Normal heart sounds.  Pulmonary:     Effort: Pulmonary effort is normal.     Breath sounds: Normal breath sounds.  Musculoskeletal:     Left shoulder: No tenderness. Normal range of motion.     Comments: Left trapezius muscle tender to palpation. No bony tenderness in left shoulder with normal ROM of left shoulder.   Skin:    General: Skin is warm and dry.  Neurological:     General: No focal deficit present.     Mental Status: She is alert. Mental status is at baseline.  Psychiatric:        Mood and Affect: Mood normal.        Behavior: Behavior normal.        Thought Content: Thought content normal.        Judgment: Judgment normal.        Assessment & Plan:   Problem List Items Addressed This Visit     Establishing care with new doctor, encounter for - Primary   Irregular menstrual  cycle   Past 2 months menstrual cycle has been irregular and shorter than normal.  Denies possibility of pregnancy. Will get pregnancy test to be sure. Under more stress than normal with recent move to [facility].  Will get labs today to rule out physical cause.       Relevant Orders   Comprehensive metabolic panel  Hemoglobin A1c   TSH + free T4   Beta hCG quant (ref lab)   Obesity (BMI 35.0-39.9 without comorbidity)   Recommend food journal to assess number of calories eating per day. Continue to exercise. Get back on plan if deviate from healthy eating plan.         Relevant Orders   Comprehensive metabolic panel   Hemoglobin A1c   TSH + free T4   Ambulatory referral to Plastic Surgery   Large breasts   Referral to plastic surgery for consultation for breast reduction surgery.       Relevant Orders   Ambulatory referral to Plastic Surgery   Chronic midline thoracic back pain   Attributes back pain to large breast. No injury. No bony tenderness. No indication for x-ray today. She will start naproxen 500 mg BID once CMP results are back.       Relevant Medications   naproxen (NAPROSYN) 500 MG tablet   Other Relevant Orders   Comprehensive metabolic panel   Strain of left trapezius muscle   Tenderness to palpation to left trapezius muscle. Will check kidney function today. Naproxen 500 mg BID with food once kidney function is established.        Relevant Medications   naproxen (NAPROSYN) 500 MG tablet  Agrees with plan of care discussed.  Questions answered.   Return for CPE this week .   Novella Olive, FNP

## 2023-11-13 NOTE — Assessment & Plan Note (Signed)
Attributes back pain to large breast. No injury. No bony tenderness. No indication for x-ray today. She will start naproxen 500 mg BID once CMP results are back.

## 2023-11-14 ENCOUNTER — Encounter: Payer: Self-pay | Admitting: Family Medicine

## 2023-11-14 LAB — HEMOGLOBIN A1C
Est. average glucose Bld gHb Est-mCnc: 103 mg/dL
Hgb A1c MFr Bld: 5.2 % (ref 4.8–5.6)

## 2023-11-14 LAB — COMPREHENSIVE METABOLIC PANEL
ALT: 27 [IU]/L (ref 0–32)
AST: 36 [IU]/L (ref 0–40)
Albumin: 4.5 g/dL (ref 4.0–5.0)
Alkaline Phosphatase: 82 [IU]/L (ref 44–121)
BUN/Creatinine Ratio: 10 (ref 9–23)
BUN: 9 mg/dL (ref 6–20)
Bilirubin Total: 0.9 mg/dL (ref 0.0–1.2)
CO2: 22 mmol/L (ref 20–29)
Calcium: 9.8 mg/dL (ref 8.7–10.2)
Chloride: 102 mmol/L (ref 96–106)
Creatinine, Ser: 0.92 mg/dL (ref 0.57–1.00)
Globulin, Total: 2.9 g/dL (ref 1.5–4.5)
Glucose: 78 mg/dL (ref 70–99)
Potassium: 4.4 mmol/L (ref 3.5–5.2)
Sodium: 141 mmol/L (ref 134–144)
Total Protein: 7.4 g/dL (ref 6.0–8.5)
eGFR: 88 mL/min/{1.73_m2} (ref >59)

## 2023-11-14 LAB — TSH+FREE T4
Free T4: 1.34 ng/dL (ref 0.82–1.77)
TSH: 1.62 u[IU]/mL (ref 0.450–4.500)

## 2023-11-14 LAB — BETA HCG QUANT (REF LAB): hCG Quant: <1 m[IU]/mL

## 2023-11-16 ENCOUNTER — Encounter: Payer: Medicaid Other | Admitting: Family Medicine

## 2023-11-26 ENCOUNTER — Encounter: Payer: Self-pay | Admitting: Family Medicine

## 2023-11-26 ENCOUNTER — Ambulatory Visit (INDEPENDENT_AMBULATORY_CARE_PROVIDER_SITE_OTHER): Payer: Medicaid Other | Admitting: Family Medicine

## 2023-11-26 ENCOUNTER — Other Ambulatory Visit: Payer: Self-pay | Admitting: Family Medicine

## 2023-11-26 VITALS — BP 101/61 | HR 64 | Temp 98.4°F | Resp 18 | Ht 67.0 in | Wt 246.8 lb

## 2023-11-26 DIAGNOSIS — E66812 Obesity, class 2: Secondary | ICD-10-CM | POA: Insufficient documentation

## 2023-11-26 DIAGNOSIS — Z0000 Encounter for general adult medical examination without abnormal findings: Secondary | ICD-10-CM

## 2023-11-26 DIAGNOSIS — Z1159 Encounter for screening for other viral diseases: Secondary | ICD-10-CM | POA: Diagnosis not present

## 2023-11-26 DIAGNOSIS — Z124 Encounter for screening for malignant neoplasm of cervix: Secondary | ICD-10-CM | POA: Insufficient documentation

## 2023-11-26 DIAGNOSIS — Z136 Encounter for screening for cardiovascular disorders: Secondary | ICD-10-CM

## 2023-11-26 DIAGNOSIS — Z13220 Encounter for screening for lipoid disorders: Secondary | ICD-10-CM | POA: Diagnosis not present

## 2023-11-26 DIAGNOSIS — E661 Drug-induced obesity: Secondary | ICD-10-CM | POA: Insufficient documentation

## 2023-11-26 MED ORDER — WEGOVY 0.25 MG/0.5ML ~~LOC~~ SOAJ: 0.2500 mg | SUBCUTANEOUS | 0 refills | Status: DC

## 2023-11-26 NOTE — Progress Notes (Signed)
 Complete physical exam  Patient: Maria Simpson   DOB: 06-Apr-1996   28 y.o. Female  MRN: [id number]  Subjective:    Chief Complaint  Patient presents with   Annual Exam    Patient is here for annual physical and patient is fasting     Ja Deah Escandon is a 28 y.o. female who presents today for a complete physical exam. She reports consuming a general diet. Gym/ health club routine includes cardio and low impact aerobics. She generally feels well. She reports sleeping well. She does not have additional problems to discuss today.    Most recent fall risk assessment:    11/13/2023    1:26 PM  Fall Risk   Falls in the past year? 0  Number falls in past yr: 0  Injury with Fall? 0  Risk for fall due to : No Fall Risks  Follow up Falls evaluation completed     Most recent depression screenings:    11/13/2023    1:26 PM  PHQ 2/9 Scores  PHQ - 2 Score 0  PHQ- 9 Score 6    Vision:Within last year and Dental: No current dental problems and Receives regular dental care    Patient Care Team: Novella Olive, FNP as PCP - General (Family Medicine) Huel Cote, MD as Consulting Physician (Obstetrics and Gynecology)   Outpatient Medications Prior to Visit  Medication Sig   naproxen (NAPROSYN) 500 MG tablet Take 1 tablet (500 mg total) by mouth 2 (two) times daily with a meal. (Patient not taking: Reported on 11/26/2023)   No facility-administered medications prior to visit.    ROS        Objective:     BP 101/61   Pulse 64   Temp 98.4 F (36.9 C) (Oral)   Resp 18   Ht 5\' 7"  (1.702 m)   Wt 246 lb 12.8 oz (111.9 kg)   LMP 10/18/2023 (Exact Date)   SpO2 98%   BMI 38.65 kg/m  BP Readings from Last 3 Encounters:  11/26/23 101/61  11/13/23 104/70  10/25/17 110/66      Physical Exam Vitals and nursing note reviewed. Exam conducted with a chaperone present.  Constitutional:      General: She is not in acute distress.    Appearance: Normal appearance.   HENT:     Right Ear: Tympanic membrane normal.     Left Ear: Tympanic membrane normal.     Nose: Nose normal.     Mouth/Throat:     Mouth: Mucous membranes are moist.     Pharynx: Oropharynx is clear.  Eyes:     Extraocular Movements: Extraocular movements intact.  Neck:     Thyroid: No thyroid tenderness.  Cardiovascular:     Rate and Rhythm: Normal rate and regular rhythm.     Pulses:          Radial pulses are 2+ on the right side and 2+ on the left side.     Heart sounds: Normal heart sounds, S1 normal and S2 normal.  Pulmonary:     Effort: Pulmonary effort is normal.     Breath sounds: Normal breath sounds.  Abdominal:     General: Bowel sounds are normal.     Palpations: Abdomen is soft.     Tenderness: There is no abdominal tenderness.  Genitourinary:    General: Normal vulva.     Exam position: Lithotomy position.     Vagina: Normal.     Cervix:  Normal.     Adnexa: Right adnexa normal and left adnexa normal.  Musculoskeletal:        General: Normal range of motion.     Cervical back: Normal range of motion.     Right lower leg: No edema.     Left lower leg: No edema.  Lymphadenopathy:     Cervical:     Right cervical: No superficial cervical adenopathy.    Left cervical: No superficial cervical adenopathy.  Skin:    General: Skin is warm and dry.  Neurological:     General: No focal deficit present.     Mental Status: She is alert. Mental status is at baseline.  Psychiatric:        Mood and Affect: Mood normal.        Behavior: Behavior normal.        Thought Content: Thought content normal.        Judgment: Judgment normal.      No results found for any visits on 11/26/23.     Assessment & Plan:    Routine Health Maintenance and Physical Exam  Immunization History  Administered Date(s) Administered   DTaP 09/11/1996, 11/27/1996, 02/19/1997, 10/09/1997, 01/22/2002   HIB (PRP-OMP) 09/11/1996, 11/27/1996, 02/19/1997, 10/09/1997   HPV Bivalent  03/17/2015   HPV Quadrivalent 02/18/2014   Hepatitis A 09/08/2006, 05/16/2008   Hepatitis B 09-28-1996, 08/20/1996, 03/27/1997   IPV 09/11/1996, 11/27/1996, 10/09/1997, 01/22/2002   Influenza-Unspecified 11/04/2015   MMR 10/09/1997, 01/22/2002   Meningococcal Conjugate 03/17/2015   Meningococcal polysaccharide vaccine (MPSV4) 05/16/2008   PPD Test 03/17/2015   Tdap 05/06/2008, 11/04/2015, 07/24/2017    Health Maintenance  Topic Date Due   Hepatitis C Screening  Never done   HPV VACCINES (3 - 3-dose series) 06/09/2015   Cervical Cancer Screening (Pap smear)  10/25/2020   INFLUENZA VACCINE  05/03/2023   COVID-19 Vaccine (1 - 2024-25 season) Never done   DTaP/Tdap/Td (9 - Td or Tdap) 07/25/2027   HIV Screening  Completed    Discussed health benefits of physical activity, and encouraged her to engage in regular exercise appropriate for her age and condition.  Annual physical exam -     CBC with Differential/Platelet  Screening for cervical cancer -     Pap LB (liquid-based)  Encounter for lipid screening for cardiovascular disease -     Lipid panel  Need for hepatitis C screening test -     Hepatitis C antibody      Routine labs ordered.  HCM reviewed/discussed. Pap smear and Hep C ordered today. Anticipatory guidance regarding healthy weight, lifestyle and choices given. Recommend healthy diet.  Recommend approximately 150 minutes/week of moderate intensity exercise. Resistance training is good for building muscles and for bone health. Muscle mass helps to increase our metabolism and to burn more calories at rest.  Limit alcohol consumption: no more than one drink per day for women and 2 drinks per day for me. Recommend regular dental and vision exams. Always use seatbelt/lap and shoulder restraints. Recommend using smoke alarms and checking batteries at least twice a year. Recommend using sunscreen when outside.  Please know that I am here to help you with all of your  health care goals and am happy to work with you to find a solution that works best for you.  The greatest advice I have received with any changes in life are to take it one step at a time, that even means if all you can focus on is  the next 60 seconds, then do that and celebrate your victories.  With any changes in life, you will have set backs, and that is OK. The important thing to remember is, if you have a set back, it is not a failure, it is an opportunity to try again! Agrees with plan of care discussed.  Questions answered.      Return in about 1 year (around 11/25/2024) for CPE with labs.     Novella Olive, FNP

## 2023-11-27 ENCOUNTER — Encounter: Payer: Self-pay | Admitting: Family Medicine

## 2023-11-27 LAB — CBC WITH DIFFERENTIAL/PLATELET
Basophils Absolute: 0 10*3/uL (ref 0.0–0.2)
Basos: 1 %
EOS (ABSOLUTE): 0.2 10*3/uL (ref 0.0–0.4)
Eos: 3 %
Hematocrit: 43 % (ref 34.0–46.6)
Hemoglobin: 13.8 g/dL (ref 11.1–15.9)
Immature Grans (Abs): 0 10*3/uL (ref 0.0–0.1)
Immature Granulocytes: 0 %
Lymphocytes Absolute: 2.8 10*3/uL (ref 0.7–3.1)
Lymphs: 39 %
MCH: 29.9 pg (ref 26.6–33.0)
MCHC: 32.1 g/dL (ref 31.5–35.7)
MCV: 93 fL (ref 79–97)
Monocytes Absolute: 0.3 10*3/uL (ref 0.1–0.9)
Monocytes: 4 %
Neutrophils Absolute: 3.8 10*3/uL (ref 1.4–7.0)
Neutrophils: 53 %
Platelets: 356 10*3/uL (ref 150–450)
RBC: 4.61 x10E6/uL (ref 3.77–5.28)
RDW: 12.7 % (ref 11.7–15.4)
WBC: 7.2 10*3/uL (ref 3.4–10.8)

## 2023-11-27 LAB — LIPID PANEL
Chol/HDL Ratio: 1.9 {ratio} (ref 0.0–4.4)
Cholesterol, Total: 139 mg/dL (ref 100–199)
HDL: 73 mg/dL (ref >39)
LDL Chol Calc (NIH): 56 mg/dL (ref 0–99)
Triglycerides: 40 mg/dL (ref 0–149)
VLDL Cholesterol Cal: 10 mg/dL (ref 5–40)

## 2023-11-27 LAB — HEPATITIS C ANTIBODY: Hep C Virus Ab: NONREACTIVE

## 2023-12-02 LAB — PAP LB (LIQUID-BASED): PAP Smear Comment: 0

## 2023-12-14 ENCOUNTER — Telehealth: Payer: Self-pay

## 2023-12-14 NOTE — Telephone Encounter (Signed)
 Copied from CRM [id number]. Topic: Clinical - Prescription Issue >> Dec 14, 2023 11:17 AM Nila Nephew wrote: Reason for CRM: Patient is requesting a PA for [facility] be initiated. She is calling from the pharmacy. Advised patient PAs can take time and it likely will not be completed today, per her expressed expectation.

## 2023-12-20 NOTE — Telephone Encounter (Signed)
 Prior auth for: [facility] Determination: Pending as of 12/20/23 Auth #: [id number] Valid from: n/a Patient notified via MyChart

## 2024-01-08 ENCOUNTER — Institutional Professional Consult (permissible substitution): Payer: Medicaid Other | Admitting: Plastic Surgery

## 2024-01-09 ENCOUNTER — Other Ambulatory Visit: Payer: Self-pay | Admitting: Family Medicine

## 2024-01-09 DIAGNOSIS — E661 Drug-induced obesity: Secondary | ICD-10-CM

## 2024-01-09 MED ORDER — WEGOVY 0.25 MG/0.5ML ~~LOC~~ SOAJ: 0.2500 mg | SUBCUTANEOUS | 0 refills | Status: DC

## 2024-01-30 ENCOUNTER — Encounter: Payer: Self-pay | Admitting: Family Medicine

## 2024-02-05 ENCOUNTER — Other Ambulatory Visit: Payer: Self-pay | Admitting: Family Medicine

## 2024-02-05 ENCOUNTER — Encounter: Payer: Self-pay | Admitting: Family Medicine

## 2024-02-05 DIAGNOSIS — E66812 Obesity, class 2: Secondary | ICD-10-CM

## 2024-02-07 ENCOUNTER — Ambulatory Visit: Admitting: Family Medicine

## 2024-02-08 ENCOUNTER — Encounter: Payer: Self-pay | Admitting: Family Medicine

## 2024-02-08 ENCOUNTER — Ambulatory Visit: Admitting: Family Medicine

## 2024-02-08 DIAGNOSIS — E661 Drug-induced obesity: Secondary | ICD-10-CM

## 2024-02-08 DIAGNOSIS — Z6838 Body mass index (BMI) 38.0-38.9, adult: Secondary | ICD-10-CM | POA: Diagnosis not present

## 2024-02-08 DIAGNOSIS — E66812 Obesity, class 2: Secondary | ICD-10-CM

## 2024-02-08 MED ORDER — WEGOVY 0.5 MG/0.5ML ~~LOC~~ SOAJ: 0.5000 mg | SUBCUTANEOUS | 0 refills | Status: DC

## 2024-02-08 NOTE — Progress Notes (Signed)
   Established Patient Office Visit  Subjective   Patient ID: Maria Simpson, female    DOB: 1996-02-24  Age: 28 y.o. MRN: [id number]  Chief Complaint  Patient presents with   Obesity    Discuss increasing Wegovy     HPI  Taking semaglutide  0.25 mg weekly as prescribed.  Has lost 3 pounds since last visit.  Taking injection on Sundays. Getting full faster. Works 12 hour shifts, struggling to eat on a regular basis.  Nausea has improved.  Will increase dose today. Discussed resistance training to minimize lean muscle mass loss.   ROS    Objective:     BP 113/76 (BP Location: Left Arm, Patient Position: Sitting, Cuff Size: Large)   Pulse 76   Temp 99.1 F (37.3 C) (Oral)   Ht 5\' 7"  (1.702 m)   Wt 243 lb (110.2 kg)   LMP 01/07/2024 (Exact Date)   SpO2 97%   BMI 38.06 kg/m    Physical Exam   No results found for any visits on 02/08/24.    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 This has improved. Has lost 3 pounds since last visit. Discussed eating smaller meals through out the day as well as adding resistance training at least 2 times per week to reduce lean muscle mass loss.  Increase dose today to 0.5 mg weekly. Follow-up in 4 weeks for medication management.       Relevant Medications   Semaglutide -Weight Management (WEGOVY ) 0.5 MG/0.5ML SOAJ  Agrees with plan of care discussed.  Questions answered.   Return in about 4 weeks (around 03/07/2024) for weight loss medication management .    Mickiel Albany, FNP

## 2024-02-08 NOTE — Assessment & Plan Note (Signed)
 Tolerated Wegovy  0.25 mg weekly fairly well with nausea in the beginning. This has improved. Has lost 3 pounds since last visit. Discussed eating smaller meals through out the day as well as adding resistance training at least 2 times per week to reduce lean muscle mass loss.  Increase dose today to 0.5 mg weekly. Follow-up in 4 weeks for medication management.

## 2024-03-13 ENCOUNTER — Telehealth: Admitting: Family Medicine

## 2024-03-14 ENCOUNTER — Encounter: Payer: Self-pay | Admitting: Family Medicine

## 2024-03-18 ENCOUNTER — Other Ambulatory Visit: Payer: Self-pay | Admitting: Family Medicine

## 2024-03-18 DIAGNOSIS — E669 Obesity, unspecified: Secondary | ICD-10-CM

## 2024-03-18 MED ORDER — SEMAGLUTIDE-WEIGHT MANAGEMENT 1 MG/0.5ML ~~LOC~~ SOAJ: 1.0000 mg | SUBCUTANEOUS | 0 refills | Status: DC

## 2024-03-19 ENCOUNTER — Telehealth: Admitting: Family Medicine

## 2024-05-29 ENCOUNTER — Ambulatory Visit: Admitting: Family Medicine

## 2024-06-05 ENCOUNTER — Encounter: Payer: Self-pay | Admitting: Family Medicine

## 2024-06-05 ENCOUNTER — Ambulatory Visit: Admitting: Family Medicine

## 2024-06-05 VITALS — BP 103/67 | HR 65 | Temp 98.6°F | Ht 67.0 in | Wt 239.0 lb

## 2024-06-05 DIAGNOSIS — N912 Amenorrhea, unspecified: Secondary | ICD-10-CM | POA: Diagnosis not present

## 2024-06-05 DIAGNOSIS — J0100 Acute maxillary sinusitis, unspecified: Secondary | ICD-10-CM | POA: Insufficient documentation

## 2024-06-05 DIAGNOSIS — E669 Obesity, unspecified: Secondary | ICD-10-CM

## 2024-06-05 LAB — POCT URINE PREGNANCY: Preg Test, Ur: NEGATIVE

## 2024-06-05 MED ORDER — SEMAGLUTIDE-WEIGHT MANAGEMENT 1 MG/0.5ML ~~LOC~~ SOAJ: 1.0000 mg | SUBCUTANEOUS | 0 refills | Status: DC

## 2024-06-05 MED ORDER — AMOXICILLIN-POT CLAVULANATE 875-125 MG PO TABS: 1.0000 | ORAL_TABLET | Freq: Two times a day (BID) | ORAL | 0 refills | Status: AC

## 2024-06-05 MED ORDER — FLUTICASONE PROPIONATE 50 MCG/ACT NA SUSP: 2.0000 | Freq: Every day | NASAL | 0 refills | Status: AC

## 2024-06-05 NOTE — Assessment & Plan Note (Signed)
 Nasal congestion for 3-4 weeks. Sinus pain and pressure. Maxillary sinus tenderness upon palpation.  Sinus rinses. Augmentin  8750125 mg BID for 10 days. Follow-up if symptoms do not resolve.

## 2024-06-05 NOTE — Assessment & Plan Note (Signed)
 Multiple negative home pregnancy test. Negative POC pregnancy in office today. Discussed serum test, declines due to multiple negative test.

## 2024-06-05 NOTE — Progress Notes (Signed)
 Acute Office Visit  Subjective:     Patient ID: Maria Simpson, female    DOB: 05-19-1996, 28 y.o.   MRN: [id number]  Chief Complaint  Patient presents with   Possible Pregnancy    Missed Period by three or four weeks. I have also been sick with a cold for about that long    HPI Patient is in today for missing her period.  LMP 04/10/24 Sharp pain in left breast. No nausea. Multiple negative pregnancy test at home. Last sexual activity, 4 weeks ago.  Has been ill with URI. Continues to have nasal congestion and coughing. No fever. Home Covid was negative. Does not feel ill.  Sinus pain and pressure.   Wants refill on semaglutide . Last injection first of August due to being ill.  Reports one day nausea, no abdominal pain.  Wants to restart and stay at 0.5 mg dose.   ROS      Objective:    BP 103/67   Pulse 65   Temp 98.6 F (37 C) (Oral)   Ht 5' 7 (1.702 m)   Wt 239 lb (108.4 kg)   LMP 04/10/2024 (Exact Date)   SpO2 97%   BMI 37.43 kg/m    Physical Exam Vitals and nursing note reviewed.  Constitutional:      General: She is not in acute distress.    Appearance: Normal appearance.  HENT:     Right Ear: Tympanic membrane normal.     Left Ear: Tympanic membrane normal.     Nose:     Right Sinus: Maxillary sinus tenderness present.     Left Sinus: Maxillary sinus tenderness present.     Mouth/Throat:     Pharynx: Uvula midline. No oropharyngeal exudate or posterior oropharyngeal erythema.  Cardiovascular:     Rate and Rhythm: Normal rate and regular rhythm.     Heart sounds: Normal heart sounds.  Pulmonary:     Effort: Pulmonary effort is normal.     Breath sounds: Normal breath sounds.  Skin:    General: Skin is warm and dry.  Neurological:     General: No focal deficit present.     Mental Status: She is alert. Mental status is at baseline.  Psychiatric:        Mood and Affect: Mood normal.        Behavior: Behavior normal.        Thought  Content: Thought content normal.        Judgment: Judgment normal.     Results for orders placed or performed in visit on 06/05/24  POCT urine pregnancy  Result Value Ref Range   Preg Test, Ur Negative Negative        Assessment & Plan:   Problem List Items Addressed This Visit     Amenorrhea - Primary   Multiple negative home pregnancy test. Negative POC pregnancy in office today. Discussed serum test, declines due to multiple negative test.       Relevant Orders   POCT urine pregnancy (Completed)   Obesity (BMI 35.0-39.9 without comorbidity)   Stopped semaglutide  0.5 mg first of August due to upper respiratory infection. Will start back on same dose today. Follow-up in 4 weeks for medication management. Okay for My Chart visit.       Relevant Medications   semaglutide -weight management (WEGOVY ) 1 MG/0.5ML SOAJ SQ injection   Acute non-recurrent maxillary sinusitis   Nasal congestion for 3-4 weeks. Sinus pain and pressure. Maxillary sinus tenderness upon  palpation.  Sinus rinses. Augmentin  8750125 mg BID for 10 days. Follow-up if symptoms do not resolve.       Relevant Medications   amoxicillin -clavulanate (AUGMENTIN ) 875-125 MG tablet   fluticasone  (FLONASE ) 50 MCG/ACT nasal spray  Agrees with plan of care discussed.  Questions answered.   Meds ordered this encounter  Medications   amoxicillin -clavulanate (AUGMENTIN ) 875-125 MG tablet    Sig: Take 1 tablet by mouth 2 (two) times daily.    Dispense:  20 tablet    Refill:  0    Supervising Provider:   METHENEY, CATHERINE D [[id number]]   fluticasone  (FLONASE ) 50 MCG/ACT nasal spray    Sig: Place 2 sprays into both nostrils daily.    Dispense:  11.1 g    Refill:  0    Supervising Provider:   METHENEY, CATHERINE D [[id number]]   semaglutide -weight management (WEGOVY ) 1 MG/0.5ML SOAJ SQ injection    Sig: Inject 1 mg into the skin once a week.    Dispense:  2 mL    Refill:  0    Supervising Provider:   METHENEY,  CATHERINE D [[id number]]  Agrees with plan of care discussed.  Questions answered.   Return in about 4 weeks (around 07/03/2024) for obesity on My Chart .  Darice JONELLE Brownie, FNP

## 2024-06-05 NOTE — Assessment & Plan Note (Signed)
 Stopped semaglutide  0.5 mg first of August due to upper respiratory infection. Will start back on same dose today. Follow-up in 4 weeks for medication management. Okay for My Chart visit.

## 2024-06-13 ENCOUNTER — Encounter: Payer: Self-pay | Admitting: Family Medicine

## 2024-07-04 ENCOUNTER — Telehealth: Admitting: Family Medicine

## 2024-09-07 ENCOUNTER — Other Ambulatory Visit: Payer: Self-pay | Admitting: Family Medicine

## 2024-09-07 DIAGNOSIS — E669 Obesity, unspecified: Secondary | ICD-10-CM

## 2024-10-06 ENCOUNTER — Other Ambulatory Visit: Payer: Self-pay | Admitting: Family Medicine

## 2024-10-06 DIAGNOSIS — E669 Obesity, unspecified: Secondary | ICD-10-CM

## 2024-10-19 NOTE — Progress Notes (Signed)
 Subjective:    Maria Simpson is a 29 y.o. female  who complains of a missed period, sporadic pelvic discomfort, and n/v x1wk. This am, she also noticed a change in vaginal discharge (thin and cloudy). Patient denies burning with urination, frequency, urgency, blood in urine, LBP, fever, urinary odor, genital itching, and rash.  Patient does not have a history of recurrent UTI, pyelonephritis, or STI. Reports concerns regarding possible sexually transmitted diseases as she has been sexually active with a new partner whose status she does not know. Reports additional concern for pregnancy because her LMP ended Dec. 11, 2025. States her periods are typically regular. She does not take an OCP or have an IUD. [id number]. Both term pregnancies were without complication.  The following portions of the patient's history were reviewed and updated as appropriate: allergies, current medications, past family history, past medical history, past social history, past surgical history and problem list.  Review of Systems Review of Systems - All systems reviewed and are negative except what is noted in the HPI.   Objective:   Vitals:   10/19/24 0930  BP: 108/75  Pulse: 61  Temp: 98.8 F (37.1 C)  TempSrc: Oral  Weight: 244 lb (110.7 kg)  Height: 5' 7 (1.702 m)    General: alert, appears stated age, cooperative and no distress, appears well but anxious Pulm: lungs clear to auscultation b/l. No signs of respiratory distress, breathing is not labored CV: regular rate and rhythm. No murmur, click, rubs, or gallop. No peripheral edema. Abd: abdomen non-TTP diffusely. Bowel sounds normoactive throughout. No masses or organomegaly. Negative McBurney.  Back: no CVA tenderness  GU: defer exam   Recent Results (from the past 14 hours)  POC Urine Dipstick   Collection Time: 10/19/24 10:32 AM  Result Value Ref Range   Color Yellow Yellow   Clarity Clear Clear   Glucose,Urine Negative Negative mg/dL    Bilirubin Negative Negative   Ketones,Urine Negative Negative mg/dL   Specific Gravity 8.979 1.005, 1.010, 1.015, 1.020, 1.025, 1.030   Blood Negative Negative   pH 6.0    Protein Negative Negative mg/dL   Urobilinogen 0.2 0.2, 1.0 EU/dL   Nitrite Negative Negative   Leukocyte Esterase 1+ (A) Negative  POCT urine pregnancy test   Collection Time: 10/19/24 10:32 AM  Result Value Ref Range   HCG Negative Negative   UHCG Internal Control Acceptable        Assessment:   1. Encounter for pregnancy test, result negative   2. Vaginal discharge   3. Menstrual period late   4. Nausea and vomiting, unspecified vomiting type       Differential includes: UTI, STI, yeast, BV, nephrolithiasis  Comorbid Conditions  Problem List[1]    Plan:   1. Medications and orders: Orders Placed This Encounter  Procedures   Culture, Urine Urine   BV, Candida, TV, CT, & GC, NAA Vag Swab Vaginal   POC Urine Dipstick   POCT urine pregnancy test   Encounter Medications[2] I have started Dakisha Fendley on ondansetron .   Patient is comfortable with not treating potential infection until results of culture/vaginal swab are known.    2. Maintain adequate hydration. Culture was sent. Swab was sent.  3. Advised patient to follow up with their new OBGYN next week as previously scheduled  4. The patient was counseled to present to the ED if they experience severe or worsening pain, an inability to urinate, fever, cannot stop vomiting, your vomit is bright red or  looks like black coffee grounds, you have bloody or black poop (stools), you have a very bad headache or stiff neck, or you have very bad pain, cramping, or bloating in your belly (abdomen).  Previsit planning was completed via snapshot and review of chart.  Patient/family verbalized to me that they understood what their problem is, what they need to do about it, and why it is important that they do it.  The patient/family voices  understanding of all medications. No barriers to adherence were noted. Patient is taking all medications as prescribed and is tolerating well.  Plan for follow-up as discussed or as needed if any worsening symptoms or change in condition.  After Visit Summary is available through MyChart.          [1] Patient Active Problem List Diagnosis   Late prenatal care (*)   Large breasts   Irregular menstrual cycle   Chronic midline thoracic back pain   Bilateral pendulous breasts   Amenorrhea   Acute non-recurrent maxillary sinusitis   Obesity (BMI 35.0-39.9 without comorbidity)   Pain of left clavicle   Strain of left trapezius muscle   Uterine size-date discrepancy (*)  [2] Outpatient Encounter Medications as of 10/19/2024  Medication Sig Dispense Refill   fluticasone  propionate (FLONASE ) 50 mcg/actuation nasal spray two sprays by Nasal route daily.     naproxen  (NAPROSYN ) 500 mg tablet Take one tablet (500 mg dose) by mouth 2 (two) times a day with meals.     ondansetron  (ZOFRAN -ODT) 4 mg disintegrating tablet Take one tablet (4 mg dose) by mouth every 8 (eight) hours as needed for Nausea (If one tablet does not provide sufficient relief of nausea, you may take 2 tablets at one time.) for up to 5 days. 15 tablet 0   semaglutide -weight management (WEGOVY ) 1 mg/0.5 mL SOAJ injection Inject 0.5 mLs (1 mg dose) into the skin once a week.     [DISCONTINUED] trimethoprim -polymyxin b (POLYTRIM) ophthalmic solution Place one drop into the right eye every 4 (four) hours for 7 days. 10 mL 0   No facility-administered encounter medications on file as of 10/19/2024.

## 2024-10-22 ENCOUNTER — Other Ambulatory Visit (HOSPITAL_COMMUNITY): Payer: Self-pay

## 2024-10-22 ENCOUNTER — Telehealth: Payer: Self-pay

## 2024-10-22 NOTE — Telephone Encounter (Signed)
 Pharmacy Patient Advocate Encounter   Received notification from On[employer] CMM KEY that prior authorization for Wegovy  1MG /0.5ML auto-injectors } is required/requested.   Insurance verification completed.   The patient is insured through [employer].   Per test claim: PA required; PA submitted to above mentioned insurance via Latent Key/confirmation #/EOC [facility] Status is pending

## 2024-10-23 NOTE — Telephone Encounter (Signed)
 Routing to the covering provider. The prior authorization for Wegovy  was denied by the insurance. Please note the reasoning below. The patient has been updated of the outcome via a MyChart message.  We may be able to approve this drug when we see certain records (documentation that you are currently on and will continue lifestyle modification including structured nutrition and physical activity, unless physical activity is not clinically appropriate at the time therapy will commence with the requested drug). If we receive these records, we may need more information (if you will use the requested drug with another similar drug; if there are reasons you should not use the requested drug [Food and Drug Administration-labeled contraindications]). We based this decision on your health plans prior authorization clinical criteria named GLP1s (glucagon-like peptide 1s) for Weight Management

## 2024-10-23 NOTE — Telephone Encounter (Signed)
 Pharmacy Patient Advocate Encounter  Received notification from [employer] that Prior Authorization for Wegovy  1MG /0.5ML auto-injectors  has been DENIED.  See denial reason below. No denial letter attached in CMM. Will attach denial letter to Media tab once received.  We may be able to approve this drug when we see certain records (documentation that you are currently on and will continue lifestyle modification including structured nutrition and physical activity, unless physical activity is not clinically appropriate at the time therapy will commence with the requested drug). If we receive these records, we may need more information (if you will use the requested drug with another similar drug; if there are reasons you should not use the requested drug [Food and Drug Administration-labeled contraindications]). We based this decision on your health plans prior authorization clinical criteria named GLP1s (glucagon-like peptide 1s) for Weight Management  PA #/Case ID/Reference #: [id number]
